# Patient Record
Sex: Female | Born: 1955 | Race: White | Hispanic: No | Marital: Married | Smoking: Former smoker
Health system: Southern US, Community
[De-identification: ages and names within clinical notes are randomized; demographics above are authoritative.]

## PROBLEM LIST (undated history)

## (undated) DIAGNOSIS — C801 Malignant (primary) neoplasm, unspecified: Secondary | ICD-10-CM

## (undated) DIAGNOSIS — N189 Chronic kidney disease, unspecified: Secondary | ICD-10-CM

## (undated) DIAGNOSIS — Z9109 Other allergy status, other than to drugs and biological substances: Secondary | ICD-10-CM

## (undated) DIAGNOSIS — I10 Essential (primary) hypertension: Secondary | ICD-10-CM

## (undated) DIAGNOSIS — E7800 Pure hypercholesterolemia, unspecified: Secondary | ICD-10-CM

## (undated) DIAGNOSIS — M1990 Unspecified osteoarthritis, unspecified site: Secondary | ICD-10-CM

## (undated) DIAGNOSIS — F902 Attention-deficit hyperactivity disorder, combined type: Secondary | ICD-10-CM

## (undated) DIAGNOSIS — F419 Anxiety disorder, unspecified: Secondary | ICD-10-CM

## (undated) DIAGNOSIS — K219 Gastro-esophageal reflux disease without esophagitis: Secondary | ICD-10-CM

## (undated) HISTORY — DX: Gastro-esophageal reflux disease without esophagitis: K21.9

## (undated) HISTORY — PX: COLONOSCOPY: SHX174

## (undated) HISTORY — DX: Chronic kidney disease, unspecified: N18.9

## (undated) HISTORY — PX: RENAL BIOPSY, OPEN: SUR143

## (undated) HISTORY — DX: Pure hypercholesterolemia, unspecified: E78.00

## (undated) HISTORY — DX: Essential (primary) hypertension: I10

## (undated) HISTORY — PX: POLYPECTOMY: SHX149

## (undated) HISTORY — PX: BREAST BIOPSY: SHX20

## (undated) HISTORY — DX: Anxiety disorder, unspecified: F41.9

## (undated) HISTORY — DX: Unspecified osteoarthritis, unspecified site: M19.90

## (undated) HISTORY — DX: Attention-deficit hyperactivity disorder, combined type: F90.2

## (undated) HISTORY — DX: Other allergy status, other than to drugs and biological substances: Z91.09

## (undated) HISTORY — DX: Malignant (primary) neoplasm, unspecified: C80.1

---

## 2000-12-09 ENCOUNTER — Other Ambulatory Visit: Admission: RE | Admit: 2000-12-09 | Discharge: 2000-12-09 | Payer: Self-pay | Admitting: Obstetrics and Gynecology

## 2002-11-06 ENCOUNTER — Other Ambulatory Visit: Admission: RE | Admit: 2002-11-06 | Discharge: 2002-11-06 | Payer: Self-pay | Admitting: Obstetrics and Gynecology

## 2004-12-01 ENCOUNTER — Other Ambulatory Visit: Admission: RE | Admit: 2004-12-01 | Discharge: 2004-12-01 | Payer: Self-pay | Admitting: Obstetrics and Gynecology

## 2005-12-03 ENCOUNTER — Other Ambulatory Visit: Admission: RE | Admit: 2005-12-03 | Discharge: 2005-12-03 | Payer: Self-pay | Admitting: Obstetrics and Gynecology

## 2008-11-12 ENCOUNTER — Ambulatory Visit: Payer: Self-pay | Admitting: Internal Medicine

## 2008-11-13 ENCOUNTER — Telehealth: Payer: Self-pay | Admitting: Internal Medicine

## 2008-11-14 ENCOUNTER — Encounter: Payer: Self-pay | Admitting: Internal Medicine

## 2008-11-28 ENCOUNTER — Ambulatory Visit: Payer: Self-pay | Admitting: Internal Medicine

## 2008-11-28 ENCOUNTER — Encounter: Payer: Self-pay | Admitting: Internal Medicine

## 2008-11-29 ENCOUNTER — Encounter: Payer: Self-pay | Admitting: Internal Medicine

## 2010-05-20 NOTE — Miscellaneous (Signed)
Summary: Previsit  Clinical Lists Changes  Medications: Added new medication of MOVIPREP 100 GM  SOLR (PEG-KCL-NACL-NASULF-NA ASC-C) As directed - Signed Rx of MOVIPREP 100 GM  SOLR (PEG-KCL-NACL-NASULF-NA ASC-C) As directed;  #1 x 0;  Signed;  Entered by: Clide Cliff RN;  Authorized by: Hilarie Fredrickson MD;  Method used: [facility] [phone]*, [address] [place], [place]  [address], Ph: [phone] or [phone], Fax: [phone] Observations: Added new observation of NKA: T (11/12/2008 8:32)    Prescriptions: MOVIPREP 100 GM  SOLR (PEG-KCL-NACL-NASULF-NA ASC-C) As directed  #1 x 0   Entered by:   Clide Cliff RN   Authorized by:   Hilarie Fredrickson MD   Signed by:   Clide Cliff RN on 11/12/2008   Method used:   [facility]ronically to        [facility] [phone]* (retail)       [address]       [place], [place]  [address]       Ph: [phone] or [phone]       Fax: [phone]   RxID:   [id number]

## 2010-05-20 NOTE — Medication Information (Signed)
Summary: [facility]REP [facility]  [facility]P [facility]   Imported By: [address] 11/15/2008 09:15:28  _____________________________________________________________________  External Attachment:    Type:   Image     Comment:   External Document

## 2010-05-20 NOTE — Letter (Signed)
Summary: Patient Notice- Polyp Results  [facility]  [address] [place], [place] [address]   Phone: [phone]  Fax: [phone]        November 29, 2008 MRN: [id number]    Anaria Krizan [address] [place], [place]  [address]    Dear Ms. Bartolomei,  I am pleased to inform you that the colon polyp(s) removed during your recent colonoscopy was (were) found to be benign (no cancer detected) upon pathologic examination.  I recommend you have a repeat colonoscopy examination in 3 years to look for recurrent polyps, as having colon polyps increases your risk for having recurrent polyps or even colon cancer in the future.  Should you develop new or worsening symptoms of abdominal pain, bowel habit changes or bleeding from the rectum or bowels, please schedule an evaluation with either your primary care physician or with me.  Additional information/recommendations:  __ No further action with gastroenterology is needed at this time. Please      follow-up with your primary care physician for your other healthcare      needs.    Please call us if you are having persistent problems or have questions about your condition that have not been fully answered at this time.  Sincerely,  Hilarie Fredrickson MD  This letter has been electronically signed by your physician.

## 2010-05-20 NOTE — Progress Notes (Signed)
Summary: Prep   Phone Note Call from Patient Call back at Home Phone [phone]   Caller: Patient Call For: Dr Marina Goodell Reason for Call: Talk to Nurse Details for Reason: Prep Pre-Auth Summary of Call: Pt stated her insurance will not pay for her prep; Stated her pharmacy called, she just wanted to make sure we got approval. Initial call taken by: Dwan Bolt,  November 13, 2008 9:36 AM  Follow-up for Phone Call        called [employer] Attn:  Florestine Avers Prep  they gave it an override and it was approved.  [facility] and informed [facility] of this.  Attempted to call patient no answer.   Follow-up by: [place] Cage NCMA,  November 14, 2008 8:14 AM

## 2011-04-21 DIAGNOSIS — C801 Malignant (primary) neoplasm, unspecified: Secondary | ICD-10-CM

## 2011-04-21 HISTORY — DX: Malignant (primary) neoplasm, unspecified: C80.1

## 2011-09-21 ENCOUNTER — Encounter: Payer: Self-pay | Admitting: Internal Medicine

## 2011-11-18 ENCOUNTER — Ambulatory Visit (AMBULATORY_SURGERY_CENTER): Payer: BC Managed Care – PPO

## 2011-11-18 ENCOUNTER — Encounter: Payer: Self-pay | Admitting: Internal Medicine

## 2011-11-18 VITALS — Ht 63.0 in | Wt 160.0 lb

## 2011-11-18 DIAGNOSIS — Z860100 Personal history of colon polyps, unspecified: Secondary | ICD-10-CM

## 2011-11-18 MED ORDER — MOVIPREP 100 G PO SOLR: 1.0000 | Freq: Once | ORAL | Status: DC

## 2011-12-02 ENCOUNTER — Encounter: Payer: Self-pay | Admitting: Internal Medicine

## 2011-12-02 ENCOUNTER — Ambulatory Visit (AMBULATORY_SURGERY_CENTER): Payer: BC Managed Care – PPO | Admitting: Internal Medicine

## 2011-12-02 VITALS — BP 125/85 | HR 55 | Temp 97.3°F | Resp 20 | Ht 63.0 in | Wt 160.0 lb

## 2011-12-02 DIAGNOSIS — Z86010 Personal history of colonic polyps: Secondary | ICD-10-CM

## 2011-12-02 DIAGNOSIS — Z1211 Encounter for screening for malignant neoplasm of colon: Secondary | ICD-10-CM

## 2011-12-02 DIAGNOSIS — D126 Benign neoplasm of colon, unspecified: Secondary | ICD-10-CM

## 2011-12-02 MED ORDER — SODIUM CHLORIDE 0.9 % IV SOLN: 500.0000 mL | INTRAVENOUS | Status: DC

## 2011-12-02 NOTE — Progress Notes (Signed)
Patient did not have preoperative order for IV antibiotic SSI prophylaxis. (G8918)  Patient did not experience any of the following events: a burn prior to discharge; a fall within the facility; wrong site/side/patient/procedure/implant event; or a hospital transfer or hospital admission upon discharge from the facility. (G8907)  

## 2011-12-02 NOTE — Op Note (Signed)
[facility] [address] [place], [place]  [address]  COLONOSCOPY PROCEDURE REPORT  PATIENT:  Jessica Galvan, Jessica Galvan  MR#:  [id number] BIRTHDATE:  06-07-1955, 56 yrs. old  GENDER:  female ENDOSCOPIST:  Wilhemina Bonito. Eda Keys, MD REF. BY:  Surveillance Program Recall, PROCEDURE DATE:  12/02/2011 PROCEDURE:  Colonoscopy with snare polypectomy x 1 ASA CLASS:  Class II INDICATIONS:  history of pre-cancerous (adenomatous) colon polyps, surveillance and high-risk screening ; index 8-10 w/ multiple (3) and large (12mm) adenomas MEDICATIONS:   MAC sedation, administered by CRNA, propofol (Diprivan) 270 mg IV  DESCRIPTION OF PROCEDURE:   After the risks benefits and alternatives of the procedure were thoroughly explained, informed consent was obtained.  Digital rectal exam was performed and revealed no abnormalities.   The LB [id number] endoscope was introduced through the anus and advanced to the cecum, which was identified by both the appendix and ileocecal valve, without limitations.  The quality of the prep was excellent, using MoviPrep.  The instrument was then slowly withdrawn as the colon was fully examined. <<PROCEDUREIMAGES>>  FINDINGS:  A diminutive polyp was found in the ascending colon and snared without cautery. No meaningful tissue retrieved for submission.  Mild diverticulosis was found in the sigmoid colon. Otherwise normal colonoscopy without other polyps, masses, vascular ectasias, or inflammatory changes.   Retroflexed views in the rectum revealed no abnormalities.    The time to cecum = 2:39 minutes. The scope was then withdrawn in 11:57  minutes from the cecum and the procedure completed.  COMPLICATIONS:  None  ENDOSCOPIC IMPRESSION: 1) Diminutive polyp in the ascending colon - removed 2) Mild diverticulosis in the sigmoid colon 3) Otherwise normal colonoscopy  RECOMMENDATIONS: 1) Follow up colonoscopy in 5 years  ______________________________ Wilhemina Bonito.  Eda Keys, MD  CC:  Elias Else, MD;   The Patient  n. eSIGNED:   Wilhemina Bonito. Eda Keys at 12/02/2011 09:15 AM  Sherrie Mustache, [phone]

## 2011-12-02 NOTE — Patient Instructions (Signed)
YOU HAD AN ENDOSCOPIC PROCEDURE TODAY AT THE [facility]: Refer to the procedure report that was given to you for any specific questions about what was found during the examination.  If the procedure report does not answer your questions, please call your gastroenterologist to clarify.  If you requested that your care partner not be given the details of your procedure findings, then the procedure report has been included in a sealed envelope for you to review at your convenience later.  YOU SHOULD EXPECT: Some feelings of bloating in the abdomen. Passage of more gas than usual.  Walking can help get rid of the air that was put into your GI tract during the procedure and reduce the bloating. If you had a lower endoscopy (such as a colonoscopy or flexible sigmoidoscopy) you may notice spotting of blood in your stool or on the toilet paper. If you underwent a bowel prep for your procedure, then you may not have a normal bowel movement for a few days.  DIET: Your first meal following the procedure should be a light meal and then it is ok to progress to your normal diet.  A half-sandwich or bowl of soup is an example of a good first meal.  Heavy or fried foods are harder to digest and may make you feel nauseous or bloated.  Likewise meals heavy in dairy and vegetables can cause extra gas to form and this can also increase the bloating.  Drink plenty of fluids but you should avoid alcoholic beverages for 24 hours.  ACTIVITY: Your care partner should take you home directly after the procedure.  You should plan to take it easy, moving slowly for the rest of the day.  You can resume normal activity the day after the procedure however you should NOT DRIVE or use heavy machinery for 24 hours (because of the sedation medicines used during the test).    SYMPTOMS TO REPORT IMMEDIATELY: A gastroenterologist can be reached at any hour.  During normal business hours, 8:30 AM to 5:00 PM Monday through Friday,  call [phone].  After hours and on weekends, please call the GI answering service at [phone] who will take a message and have the physician on call contact you.   Following lower endoscopy (colonoscopy or flexible sigmoidoscopy):  Excessive amounts of blood in the stool  Significant tenderness or worsening of abdominal pains  Swelling of the abdomen that is new, acute  Fever of 100F or higher    FOLLOW UP: If any biopsies were taken you will be contacted by phone or by letter within the next 1-3 weeks.  Call your gastroenterologist if you have not heard about the biopsies in 3 weeks.  Our staff will call the home number listed on your records the next business day following your procedure to check on you and address any questions or concerns that you may have at that time regarding the information given to you following your procedure. This is a courtesy call and so if there is no answer at the home number and we have not heard from you through the emergency physician on call, we will assume that you have returned to your regular daily activities without incident.  SIGNATURES/CONFIDENTIALITY: You and/or your care partner have signed paperwork which will be entered into your electronic medical record.  These signatures attest to the fact that that the information above on your After Visit Summary has been reviewed and is understood.  Full responsibility of the confidentiality   of this discharge information lies with you and/or your care-partner.    INFORMATION ON DIVERTICULOSIS ,HIGH FIBER DIET & POLYPS GIVEN TO YOU TODAY

## 2011-12-02 NOTE — Progress Notes (Signed)
PT STATES SHE HAS A VERY SORE PAINFUL RECTUM. EWM

## 2011-12-03 ENCOUNTER — Telehealth: Payer: Self-pay

## 2011-12-03 NOTE — Telephone Encounter (Signed)
  Follow up Call-  Call back number 12/02/2011  Post procedure Call Back phone  # [phone]  Permission to leave phone message Yes     Patient questions:  Do you have a fever, pain , or abdominal swelling? no Pain Score  0 *  Have you tolerated food without any problems? yes  Have you been able to return to your normal activities? yes  Do you have any questions about your discharge instructions: Diet   no Medications  no Follow up visit  no  Do you have questions or concerns about your Care? no  Actions: * If pain score is 4 or above: No action needed, pain <4.  I spoke with the pt's husband he said the pt was in the shower.  He then said his wife was asking him to ask [place] why there was a red area on her rt hip about the size of quarter or half dollar.  I said I did not know why that would be there.  That the pt was positioned in the procedure room on her left side and there should not be a electro lead on the hip area.  I advised them to call back if it continues and if any discomfort.  He relayed the message to his wife.  Maw

## 2013-07-03 ENCOUNTER — Encounter: Payer: Self-pay | Admitting: Cardiology

## 2013-07-03 DIAGNOSIS — E785 Hyperlipidemia, unspecified: Secondary | ICD-10-CM

## 2013-10-21 ENCOUNTER — Encounter (HOSPITAL_COMMUNITY): Payer: Self-pay | Admitting: Emergency Medicine

## 2013-10-21 ENCOUNTER — Emergency Department (HOSPITAL_COMMUNITY)
Admission: EM | Admit: 2013-10-21 | Discharge: 2013-10-22 | Disposition: A | Discharge status: Home / Self Care | Payer: BC Managed Care – PPO | Attending: Emergency Medicine | Admitting: Emergency Medicine

## 2013-10-21 ENCOUNTER — Emergency Department (HOSPITAL_COMMUNITY): Payer: BC Managed Care – PPO

## 2013-10-21 DIAGNOSIS — M129 Arthropathy, unspecified: Secondary | ICD-10-CM | POA: Insufficient documentation

## 2013-10-21 DIAGNOSIS — Z85828 Personal history of other malignant neoplasm of skin: Secondary | ICD-10-CM | POA: Insufficient documentation

## 2013-10-21 DIAGNOSIS — F909 Attention-deficit hyperactivity disorder, unspecified type: Secondary | ICD-10-CM | POA: Insufficient documentation

## 2013-10-21 DIAGNOSIS — K219 Gastro-esophageal reflux disease without esophagitis: Secondary | ICD-10-CM | POA: Insufficient documentation

## 2013-10-21 DIAGNOSIS — E7800 Pure hypercholesterolemia, unspecified: Secondary | ICD-10-CM | POA: Insufficient documentation

## 2013-10-21 DIAGNOSIS — Z791 Long term (current) use of non-steroidal anti-inflammatories (NSAID): Secondary | ICD-10-CM | POA: Insufficient documentation

## 2013-10-21 DIAGNOSIS — N200 Calculus of kidney: Secondary | ICD-10-CM | POA: Insufficient documentation

## 2013-10-21 DIAGNOSIS — Z79899 Other long term (current) drug therapy: Secondary | ICD-10-CM | POA: Insufficient documentation

## 2013-10-21 DIAGNOSIS — Z87891 Personal history of nicotine dependence: Secondary | ICD-10-CM | POA: Insufficient documentation

## 2013-10-21 LAB — URINALYSIS, ROUTINE W REFLEX MICROSCOPIC
Bilirubin Urine: NEGATIVE
Glucose, UA: NEGATIVE mg/dL
Ketones, ur: 15 mg/dL — AB
Nitrite: NEGATIVE
Protein, ur: NEGATIVE mg/dL
Specific Gravity, Urine: 1.021 (ref 1.005–1.030)
Urobilinogen, UA: 0.2 mg/dL (ref 0.0–1.0)
pH: 8 (ref 5.0–8.0)

## 2013-10-21 LAB — URINE MICROSCOPIC-ADD ON

## 2013-10-21 MED ORDER — ONDANSETRON 8 MG PO TBDP
8.0000 mg | ORAL_TABLET | Freq: Once | ORAL | Status: AC
  Administered 2013-10-21: 8 mg via ORAL
  Filled 2013-10-21: qty 1

## 2013-10-21 MED ORDER — SODIUM CHLORIDE 0.9 % IV BOLUS (SEPSIS)
1000.0000 mL | Freq: Once | INTRAVENOUS | Status: AC
  Administered 2013-10-21: 1000 mL via INTRAVENOUS

## 2013-10-21 MED ORDER — HYDROMORPHONE HCL PF 1 MG/ML IJ SOLN
1.0000 mg | Freq: Once | INTRAMUSCULAR | Status: AC
  Administered 2013-10-21: 1 mg via INTRAVENOUS
  Filled 2013-10-21: qty 1

## 2013-10-21 MED ORDER — FENTANYL CITRATE 0.05 MG/ML IJ SOLN
50.0000 ug | Freq: Once | INTRAMUSCULAR | Status: AC
  Administered 2013-10-21: 50 ug via INTRAVENOUS
  Filled 2013-10-21: qty 2

## 2013-10-21 NOTE — ED Provider Notes (Signed)
CSN: [id number]     Arrival date & time 10/21/13  2221 History   None    Chief Complaint  Patient presents with  . Flank Pain   HPI  Jessica Galvan is a 58 y.o. female with a PMH of arthritis, skin cancer, ADHD, hypercholesteremia, and GERD who presents to the ED for evaluation of flank pain. History was provided by the patient. Patient states that about an hour PTA she developed sudden onset right back pain which has moved to her right flank and radiates to her groin. Her pain is a constant sharp stabbing pain. Nothing makes her pain better/worse. She did not take anything for pain PTA. No hx of similar pain in the past. Patient denies any hx of kidney stones in the past. Patient has had nausea with one episode of emesis in the ED. No dysuria, hematuria, difficulty with urination, diarrhea, constipation, chest pain, SOB, dizziness, lightheadedness or other concerns.    Past Medical History  Diagnosis Date  . Environmental allergies   . Arthritis   . GERD (gastroesophageal reflux disease)   . Hypercholesterolemia   . Cancer 2013    LEFT LEG  . ADHD (attention deficit hyperactivity disorder), combined type    Past Surgical History  Procedure Laterality Date  . Cesarean section      2  . Colonoscopy    . Polypectomy     Family History  Problem Relation Age of Onset  . Diabetes Mother   . Heart disease Mother   . Diabetes Father   . Heart disease Father   . Colon cancer Neg Hx   . Rectal cancer Neg Hx   . Stomach cancer Neg Hx   . Esophageal cancer Neg Hx    History  Substance Use Topics  . Smoking status: Former Smoker    Quit date: 11/18/2006  . Smokeless tobacco: Never Used  . Alcohol Use: 0.6 oz/week    1 Glasses of wine per week   OB History   Grav Para Term Preterm Abortions TAB SAB Ect Mult Living                 Review of Systems  Constitutional: Negative for fever, chills, diaphoresis, activity change, appetite change and fatigue.  Respiratory: Negative for  cough and shortness of breath.   Cardiovascular: Negative for chest pain and leg swelling.  Gastrointestinal: Positive for nausea, vomiting and abdominal pain. Negative for diarrhea and constipation.  Genitourinary: Positive for flank pain. Negative for dysuria, hematuria and difficulty urinating.  Musculoskeletal: Positive for back pain. Negative for myalgias.  Neurological: Negative for dizziness, weakness, light-headedness and headaches.   Allergies  Lipitor  Home Medications   Prior to Admission medications   Medication Sig Start Date End Date Taking? Authorizing Provider  amphetamine-dextroamphetamine (ADDERALL XR) 30 MG 24 hr capsule Take 30 mg by mouth every morning.   Yes Historical Provider, MD  meloxicam (MOBIC) 15 MG tablet Take 15 mg by mouth daily.  10/31/11  Yes Historical Provider, MD  omeprazole (PRILOSEC OTC) 20 MG tablet Take 20 mg by mouth daily.   Yes Historical Provider, MD  simvastatin (ZOCOR) 40 MG tablet Take 40 mg by mouth at bedtime.  11/09/11  Yes Historical Provider, MD   BP 166/86  Pulse 84  Temp(Src) 97.7 F (36.5 C) (Oral)  Resp 18  SpO2 100%  Filed Vitals:   10/21/13 2226 10/21/13 2242 10/22/13 0119  BP: 157/94 166/86 119/71  Pulse: 84  71  Temp:  97.7 F (36.5 C)    TempSrc: Oral    Resp: 18  16  SpO2: 100%  96%    Physical Exam  Nursing note and vitals reviewed. Constitutional: She is oriented to person, place, and time. She appears well-developed and well-nourished. No distress.  Patient appears to be in pain  HENT:  Head: Normocephalic and atraumatic.  Right Ear: External ear normal.  Left Ear: External ear normal.  Mouth/Throat: Oropharynx is clear and moist.  Eyes: Conjunctivae are normal. Right eye exhibits no discharge. Left eye exhibits no discharge.  Neck: Normal range of motion. Neck supple.  Cardiovascular: Normal rate, regular rhythm, normal heart sounds and intact distal pulses.  Exam reveals no gallop and no friction rub.    No murmur heard. Pulmonary/Chest: Effort normal and breath sounds normal. No respiratory distress. She has no wheezes. She has no rales. She exhibits no tenderness.  Abdominal: Soft. She exhibits no distension and no mass. There is tenderness. There is no rebound and no guarding.  Tenderness to palpation to the right flank   Musculoskeletal: Normal range of motion. She exhibits no edema and no tenderness.  Neurological: She is alert and oriented to person, place, and time.  Skin: Skin is warm and dry. She is not diaphoretic.     ED Course  Procedures (including critical care time) Labs Review Labs Reviewed - No data to display  Imaging Review Ct Abdomen Pelvis Wo Contrast  10/21/2013   CLINICAL DATA:  Right flank pain.  EXAM: CT ABDOMEN AND PELVIS WITHOUT CONTRAST  TECHNIQUE: Multidetector CT imaging of the abdomen and pelvis was performed following the standard protocol without IV contrast.  COMPARISON:  None.  FINDINGS: Lung bases are clear.  No effusions.  Heart is normal size.  Liver, gallbladder, spleen, pancreas, adrenals and left kidney have an unremarkable unenhanced appearance.  Punctate 2 mm stone in the proximal right ureter. Mild right hydronephrosis. No additional ureteral stones. Urinary bladder is unremarkable.  Uterus and adnexa unremarkable. Appendix is visualized and is normal. Stomach, large and small bowel grossly unremarkable. No free fluid, free air or adenopathy. No acute bony abnormality.  IMPRESSION: 2 mm proximal right ureteral stone with mild right hydronephrosis.   Electronically Signed   By: Rolm Baptise M.D.   On: 10/21/2013 23:53     EKG Interpretation None      Results for orders placed during the hospital encounter of 10/21/13  CBC WITH DIFFERENTIAL      Result Value Ref Range   WBC 7.6  4.0 - 10.5 K/uL   RBC 4.66  3.87 - 5.11 MIL/uL   Hemoglobin 14.4  12.0 - 15.0 g/dL   HCT 42.3  36.0 - 46.0 %   MCV 90.8  78.0 - 100.0 fL   MCH 30.9  26.0 - 34.0 pg    MCHC 34.0  30.0 - 36.0 g/dL   RDW 13.0  11.5 - 15.5 %   Platelets 238  150 - 400 K/uL   Neutrophils Relative % 51  43 - 77 %   Neutro Abs 3.9  1.7 - 7.7 K/uL   Lymphocytes Relative 39  12 - 46 %   Lymphs Abs 3.0  0.7 - 4.0 K/uL   Monocytes Relative 7  3 - 12 %   Monocytes Absolute 0.5  0.1 - 1.0 K/uL   Eosinophils Relative 2  0 - 5 %   Eosinophils Absolute 0.2  0.0 - 0.7 K/uL   Basophils Relative 1  0 -  1 %   Basophils Absolute 0.0  0.0 - 0.1 K/uL  COMPREHENSIVE METABOLIC PANEL      Result Value Ref Range   Sodium 144  137 - 147 mEq/L   Potassium 4.3  3.7 - 5.3 mEq/L   Chloride 103  96 - 112 mEq/L   CO2 22  19 - 32 mEq/L   Glucose, Bld 169 (*) 70 - 99 mg/dL   BUN 28 (*) 6 - 23 mg/dL   Creatinine, Ser 0.89  0.50 - 1.10 mg/dL   Calcium 10.0  8.4 - 10.5 mg/dL   Total Protein 7.6  6.0 - 8.3 g/dL   Albumin 4.5  3.5 - 5.2 g/dL   AST 24  0 - 37 U/L   ALT 22  0 - 35 U/L   Alkaline Phosphatase 72  39 - 117 U/L   Total Bilirubin <0.2 (*) 0.3 - 1.2 mg/dL   GFR calc non Af Amer 71 (*) >90 mL/min   GFR calc Af Amer 82 (*) >90 mL/min   Anion gap 19 (*) 5 - 15  URINALYSIS, ROUTINE W REFLEX MICROSCOPIC      Result Value Ref Range   Color, Urine YELLOW  YELLOW   APPearance CLOUDY (*) CLEAR   Specific Gravity, Urine 1.021  1.005 - 1.030   pH 8.0  5.0 - 8.0   Glucose, UA NEGATIVE  NEGATIVE mg/dL   Hgb urine dipstick LARGE (*) NEGATIVE   Bilirubin Urine NEGATIVE  NEGATIVE   Ketones, ur 15 (*) NEGATIVE mg/dL   Protein, ur NEGATIVE  NEGATIVE mg/dL   Urobilinogen, UA 0.2  0.0 - 1.0 mg/dL   Nitrite NEGATIVE  NEGATIVE   Leukocytes, UA TRACE (*) NEGATIVE  URINE MICROSCOPIC-ADD ON      Result Value Ref Range   Squamous Epithelial / LPF RARE  RARE   WBC, UA 0-2  <3 WBC/hpf   RBC / HPF 21-50  <3 RBC/hpf  BASIC METABOLIC PANEL      Result Value Ref Range   Sodium 144  137 - 147 mEq/L   Potassium 4.0  3.7 - 5.3 mEq/L   Chloride 110  96 - 112 mEq/L   CO2 23  19 - 32 mEq/L   Glucose, Bld  143 (*) 70 - 99 mg/dL   BUN 24 (*) 6 - 23 mg/dL   Creatinine, Ser 0.71  0.50 - 1.10 mg/dL   Calcium 8.4  8.4 - 10.5 mg/dL   GFR calc non Af Amer >90  >90 mL/min   GFR calc Af Amer >90  >90 mL/min   Anion gap 11  5 - 15     MDM   Jessica Galvan is a 58 y.o. female with a PMH of arthritis, ADHD, hypercholesteremia, and GERD who presents to the ED for evaluation of flank pain. Etiology of flank pain likely due to a 2 mm right ureteral stone with mild hydronephrosis. No evidence of a UTI. No leukocytosis. Abdominal exam benign. Patient had improvements in her pain throughout her ED visit and pain was controlled upon discharge. Patient had elevated AG (19) which reduced to 11 with IV fluids. Also had elevated BUN (28) which reduced to 24 with IV fluids. Patient given urine strainer and urology follow-up. Return precautions, discharge instructions, and follow-up was discussed with the patient before discharge.     New Prescriptions   ONDANSETRON (ZOFRAN ODT) 4 MG DISINTEGRATING TABLET    Take 1 tablet (4 mg total) by mouth every 8 (eight)  hours as needed for nausea or vomiting.   OXYCODONE-ACETAMINOPHEN (PERCOCET/ROXICET) 5-325 MG PER TABLET    Take 2 tablets by mouth every 4 (four) hours as needed for severe pain.   TAMSULOSIN (FLOMAX) 0.4 MG CAPS CAPSULE    Take 1 capsule (0.4 mg total) by mouth daily.     Final impressions: 1. Right nephrolithiasis       Harold Hedge Ryu Cerreta PA-C       Lucila Maine, PA-C 10/22/13 1119

## 2013-10-21 NOTE — ED Notes (Signed)
Pt arrived to the ED with right sided flank pain.  Pt states pain radiates to the right lower front of her abdomen.  Pt states she is not having difficulty urinating.  Pt feels nausea but had not had any episodes of emesis

## 2013-10-22 LAB — COMPREHENSIVE METABOLIC PANEL
ALT: 22 U/L (ref 0–35)
AST: 24 U/L (ref 0–37)
Albumin: 4.5 g/dL (ref 3.5–5.2)
Alkaline Phosphatase: 72 U/L (ref 39–117)
Anion gap: 19 — ABNORMAL HIGH (ref 5–15)
BUN: 28 mg/dL — ABNORMAL HIGH (ref 6–23)
CO2: 22 mEq/L (ref 19–32)
Calcium: 10 mg/dL (ref 8.4–10.5)
Chloride: 103 mEq/L (ref 96–112)
Creatinine, Ser: 0.89 mg/dL (ref 0.50–1.10)
GFR calc Af Amer: 82 mL/min — ABNORMAL LOW (ref >90)
GFR calc non Af Amer: 71 mL/min — ABNORMAL LOW (ref >90)
Glucose, Bld: 169 mg/dL — ABNORMAL HIGH (ref 70–99)
Potassium: 4.3 mEq/L (ref 3.7–5.3)
Sodium: 144 mEq/L (ref 137–147)
Total Bilirubin: <0.2 mg/dL — ABNORMAL LOW (ref 0.3–1.2)
Total Protein: 7.6 g/dL (ref 6.0–8.3)

## 2013-10-22 LAB — BASIC METABOLIC PANEL
Anion gap: 11 (ref 5–15)
BUN: 24 mg/dL — ABNORMAL HIGH (ref 6–23)
CO2: 23 mEq/L (ref 19–32)
Calcium: 8.4 mg/dL (ref 8.4–10.5)
Chloride: 110 mEq/L (ref 96–112)
Creatinine, Ser: 0.71 mg/dL (ref 0.50–1.10)
GFR calc Af Amer: >90 mL/min (ref >90)
GFR calc non Af Amer: >90 mL/min (ref >90)
Glucose, Bld: 143 mg/dL — ABNORMAL HIGH (ref 70–99)
Potassium: 4 mEq/L (ref 3.7–5.3)
Sodium: 144 mEq/L (ref 137–147)

## 2013-10-22 LAB — CBC WITH DIFFERENTIAL/PLATELET
Basophils Absolute: 0 10*3/uL (ref 0.0–0.1)
Basophils Relative: 1 % (ref 0–1)
Eosinophils Absolute: 0.2 10*3/uL (ref 0.0–0.7)
Eosinophils Relative: 2 % (ref 0–5)
HCT: 42.3 % (ref 36.0–46.0)
Hemoglobin: 14.4 g/dL (ref 12.0–15.0)
Lymphocytes Relative: 39 % (ref 12–46)
Lymphs Abs: 3 10*3/uL (ref 0.7–4.0)
MCH: 30.9 pg (ref 26.0–34.0)
MCHC: 34 g/dL (ref 30.0–36.0)
MCV: 90.8 fL (ref 78.0–100.0)
Monocytes Absolute: 0.5 10*3/uL (ref 0.1–1.0)
Monocytes Relative: 7 % (ref 3–12)
Neutro Abs: 3.9 10*3/uL (ref 1.7–7.7)
Neutrophils Relative %: 51 % (ref 43–77)
Platelets: 238 10*3/uL (ref 150–400)
RBC: 4.66 MIL/uL (ref 3.87–5.11)
RDW: 13 % (ref 11.5–15.5)
WBC: 7.6 10*3/uL (ref 4.0–10.5)

## 2013-10-22 MED ORDER — OXYCODONE-ACETAMINOPHEN 5-325 MG PO TABS: 2.0000 | ORAL_TABLET | ORAL | Status: DC | PRN

## 2013-10-22 MED ORDER — KETOROLAC TROMETHAMINE 30 MG/ML IJ SOLN
30.0000 mg | Freq: Once | INTRAMUSCULAR | Status: AC
  Administered 2013-10-22: 30 mg via INTRAVENOUS
  Filled 2013-10-22: qty 1

## 2013-10-22 MED ORDER — SODIUM CHLORIDE 0.9 % IV BOLUS (SEPSIS)
1000.0000 mL | Freq: Once | INTRAVENOUS | Status: AC
  Administered 2013-10-22: 1000 mL via INTRAVENOUS

## 2013-10-22 MED ORDER — ONDANSETRON 4 MG PO TBDP: 4.0000 mg | ORAL_TABLET | Freq: Three times a day (TID) | ORAL | Status: DC | PRN

## 2013-10-22 MED ORDER — OXYCODONE-ACETAMINOPHEN 5-325 MG PO TABS
2.0000 | ORAL_TABLET | Freq: Once | ORAL | Status: AC
  Administered 2013-10-22: 2 via ORAL
  Filled 2013-10-22: qty 2

## 2013-10-22 MED ORDER — TAMSULOSIN HCL 0.4 MG PO CAPS: 0.4000 mg | ORAL_CAPSULE | Freq: Every day | ORAL | Status: DC

## 2013-10-22 NOTE — Discharge Instructions (Signed)
Take percocet for severe pain - Please be careful with this medication.  It can cause drowsiness.  Use caution while driving, operating machinery, drinking alcohol, or any other activities that may impair your physical or mental abilities.   Take zofran as needed for nausea  Take flomax once daily until you pass your stone - this will help you pass your stone  Return to the emergency department if you develop any changing/worsening condition, uncontrolled pain, fever, repeated vomiting, or any other concerns (please read additional information regarding your condition below)   Kidney Stones Kidney stones (urolithiasis) are deposits that form inside your kidneys. The intense pain is caused by the stone moving through the urinary tract. When the stone moves, the ureter goes into spasm around the stone. The stone is usually passed in the urine.  CAUSES   A disorder that makes certain neck glands produce too much parathyroid hormone (primary hyperparathyroidism).  A buildup of uric acid crystals, similar to gout in your joints.  Narrowing (stricture) of the ureter.  A kidney obstruction present at birth (congenital obstruction).  Previous surgery on the kidney or ureters.  Numerous kidney infections. SYMPTOMS   Feeling sick to your stomach (nauseous).  Throwing up (vomiting).  Blood in the urine (hematuria).  Pain that usually spreads (radiates) to the groin.  Frequency or urgency of urination. DIAGNOSIS   Taking a history and physical exam.  Blood or urine tests.  CT scan.  Occasionally, an examination of the inside of the urinary bladder (cystoscopy) is performed. TREATMENT   Observation.  Increasing your fluid intake.  Extracorporeal shock wave lithotripsy--This is a noninvasive procedure that uses shock waves to break up kidney stones.  Surgery may be needed if you have severe pain or persistent obstruction. There are various surgical procedures. Most of the  procedures are performed with the use of small instruments. Only small incisions are needed to accommodate these instruments, so recovery time is minimized. The size, location, and chemical composition are all important variables that will determine the proper choice of action for you. Talk to your health care provider to better understand your situation so that you will minimize the risk of injury to yourself and your kidney.  HOME CARE INSTRUCTIONS   Drink enough water and fluids to keep your urine clear or pale yellow. This will help you to pass the stone or stone fragments.  Strain all urine through the provided strainer. Keep all particulate matter and stones for your health care provider to see. The stone causing the pain may be as small as a grain of salt. It is very important to use the strainer each and every time you pass your urine. The collection of your stone will allow your health care provider to analyze it and verify that a stone has actually passed. The stone analysis will often identify what you can do to reduce the incidence of recurrences.  Only take over-the-counter or prescription medicines for pain, discomfort, or fever as directed by your health care provider.  Make a follow-up appointment with your health care provider as directed.  Get follow-up X-rays if required. The absence of pain does not always mean that the stone has passed. It may have only stopped moving. If the urine remains completely obstructed, it can cause loss of kidney function or even complete destruction of the kidney. It is your responsibility to make sure X-rays and follow-ups are completed. Ultrasounds of the kidney can show blockages and the status of the kidney.  Ultrasounds are not associated with any radiation and can be performed easily in a matter of minutes. SEEK MEDICAL CARE IF:  You experience pain that is progressive and unresponsive to any pain medicine you have been prescribed. SEEK IMMEDIATE  MEDICAL CARE IF:   Pain cannot be controlled with the prescribed medicine.  You have a fever or shaking chills.  The severity or intensity of pain increases over 18 hours and is not relieved by pain medicine.  You develop a new onset of abdominal pain.  You feel faint or pass out.  You are unable to urinate. MAKE SURE YOU:   Understand these instructions.  Will watch your condition.  Will get help right away if you are not doing well or get worse. Document Released: 04/06/2005 Document Revised: 12/07/2012 Document Reviewed: 09/07/2012 [facility] Patient Information 2015 [place], [place]. This information is not intended to replace advice given to you by your health care provider. Make sure you discuss any questions you have with your health care provider.

## 2013-10-23 LAB — URINE CULTURE
Colony Count: NO GROWTH
Culture: NO GROWTH

## 2013-10-24 NOTE — ED Provider Notes (Signed)
Medical screening examination/treatment/procedure(s) were performed by non-physician practitioner and as supervising physician I was immediately available for consultation/collaboration.  Leota Jacobsen, MD 10/24/13 1006

## 2015-02-11 ENCOUNTER — Encounter: Payer: Self-pay | Admitting: Internal Medicine

## 2015-03-26 ENCOUNTER — Encounter (HOSPITAL_COMMUNITY): Payer: Self-pay

## 2015-03-26 ENCOUNTER — Emergency Department (HOSPITAL_COMMUNITY)
Admission: EM | Admit: 2015-03-26 | Discharge: 2015-03-26 | Disposition: A | Discharge status: Home / Self Care | Payer: BC Managed Care – PPO | Attending: Emergency Medicine | Admitting: Emergency Medicine

## 2015-03-26 ENCOUNTER — Emergency Department (HOSPITAL_COMMUNITY): Payer: BC Managed Care – PPO

## 2015-03-26 DIAGNOSIS — Z85831 Personal history of malignant neoplasm of soft tissue: Secondary | ICD-10-CM | POA: Insufficient documentation

## 2015-03-26 DIAGNOSIS — E7800 Pure hypercholesterolemia, unspecified: Secondary | ICD-10-CM | POA: Insufficient documentation

## 2015-03-26 DIAGNOSIS — Z87891 Personal history of nicotine dependence: Secondary | ICD-10-CM | POA: Diagnosis not present

## 2015-03-26 DIAGNOSIS — R030 Elevated blood-pressure reading, without diagnosis of hypertension: Secondary | ICD-10-CM | POA: Diagnosis not present

## 2015-03-26 DIAGNOSIS — R109 Unspecified abdominal pain: Secondary | ICD-10-CM

## 2015-03-26 DIAGNOSIS — F909 Attention-deficit hyperactivity disorder, unspecified type: Secondary | ICD-10-CM | POA: Insufficient documentation

## 2015-03-26 DIAGNOSIS — N200 Calculus of kidney: Secondary | ICD-10-CM | POA: Insufficient documentation

## 2015-03-26 DIAGNOSIS — M1990 Unspecified osteoarthritis, unspecified site: Secondary | ICD-10-CM | POA: Diagnosis not present

## 2015-03-26 DIAGNOSIS — K219 Gastro-esophageal reflux disease without esophagitis: Secondary | ICD-10-CM | POA: Diagnosis not present

## 2015-03-26 DIAGNOSIS — Z79899 Other long term (current) drug therapy: Secondary | ICD-10-CM | POA: Diagnosis not present

## 2015-03-26 DIAGNOSIS — Z791 Long term (current) use of non-steroidal anti-inflammatories (NSAID): Secondary | ICD-10-CM | POA: Diagnosis not present

## 2015-03-26 DIAGNOSIS — IMO0001 Reserved for inherently not codable concepts without codable children: Secondary | ICD-10-CM

## 2015-03-26 LAB — URINALYSIS, ROUTINE W REFLEX MICROSCOPIC
Bilirubin Urine: NEGATIVE
Glucose, UA: NEGATIVE mg/dL
Ketones, ur: 15 mg/dL — AB
Leukocytes, UA: NEGATIVE
Nitrite: NEGATIVE
Protein, ur: 30 mg/dL — AB
Specific Gravity, Urine: 1.02 (ref 1.005–1.030)
pH: 6 (ref 5.0–8.0)

## 2015-03-26 LAB — I-STAT CHEM 8, ED
BUN: 23 mg/dL — ABNORMAL HIGH (ref 6–20)
Calcium, Ion: 1.2 mmol/L (ref 1.12–1.23)
Chloride: 105 mmol/L (ref 101–111)
Creatinine, Ser: 0.8 mg/dL (ref 0.44–1.00)
Glucose, Bld: 107 mg/dL — ABNORMAL HIGH (ref 65–99)
HCT: 45 % (ref 36.0–46.0)
Hemoglobin: 15.3 g/dL — ABNORMAL HIGH (ref 12.0–15.0)
Potassium: 3.9 mmol/L (ref 3.5–5.1)
Sodium: 143 mmol/L (ref 135–145)
TCO2: 25 mmol/L (ref 0–100)

## 2015-03-26 LAB — URINE MICROSCOPIC-ADD ON: Bacteria, UA: NONE SEEN

## 2015-03-26 MED ORDER — MORPHINE SULFATE (PF) 4 MG/ML IV SOLN
4.0000 mg | Freq: Once | INTRAVENOUS | Status: AC
  Administered 2015-03-26: 4 mg via INTRAVENOUS
  Filled 2015-03-26: qty 1

## 2015-03-26 MED ORDER — HYDROCODONE-ACETAMINOPHEN 5-325 MG PO TABS: 2.0000 | ORAL_TABLET | Freq: Once | ORAL | Status: DC

## 2015-03-26 MED ORDER — SODIUM CHLORIDE 0.9 % IV BOLUS (SEPSIS)
500.0000 mL | Freq: Once | INTRAVENOUS | Status: AC
  Administered 2015-03-26: 500 mL via INTRAVENOUS

## 2015-03-26 MED ORDER — ONDANSETRON 4 MG PO TBDP
4.0000 mg | ORAL_TABLET | Freq: Once | ORAL | Status: AC
  Administered 2015-03-26: 4 mg via ORAL
  Filled 2015-03-26: qty 1

## 2015-03-26 MED ORDER — HYDROCODONE-ACETAMINOPHEN 5-325 MG PO TABS
1.0000 | ORAL_TABLET | Freq: Once | ORAL | Status: AC | PRN
Start: 2015-03-26 — End: 2015-03-26
  Administered 2015-03-26: 1 via ORAL
  Filled 2015-03-26: qty 1

## 2015-03-26 MED ORDER — NAPROXEN 500 MG PO TABS: 500.0000 mg | ORAL_TABLET | Freq: Two times a day (BID) | ORAL | Status: DC

## 2015-03-26 MED ORDER — ONDANSETRON HCL 4 MG PO TABS: 4.0000 mg | ORAL_TABLET | Freq: Three times a day (TID) | ORAL | Status: DC | PRN

## 2015-03-26 MED ORDER — OXYCODONE-ACETAMINOPHEN 5-325 MG PO TABS: 1.0000 | ORAL_TABLET | ORAL | Status: DC | PRN

## 2015-03-26 NOTE — ED Notes (Signed)
Patient requesting something for pain. PA-C made aware of same.

## 2015-03-26 NOTE — ED Notes (Signed)
Pt has left flank pain starting 2-3 hours ago.  Pt has hx of kidney stones.  Urine is dark.  No fever.  No vomiting.  Nausea present

## 2015-03-26 NOTE — ED Provider Notes (Signed)
CSN: [id number]     Arrival date & time 03/26/15  1404 History   None    Chief Complaint  Patient presents with  . Flank Pain     (Consider location/radiation/quality/duration/timing/severity/associated sxs/prior Treatment) Patient is a 59 y.o. female presenting with flank pain. The history is provided by the patient and medical records. No language interpreter was used.  Flank Pain Associated symptoms include nausea. Pertinent negatives include no abdominal pain, arthralgias, congestion, coughing, headaches, myalgias, neck pain, rash, sore throat, vomiting or weakness.  Jessica Galvan is a 59 y.o. female  who presents to the Emergency Department complaining of acute onset, waxing-waning left flank pain since ~ noon today. Associated symptoms include nausea and left back pain. Denies fever, emesis, and urinary symptoms. No medications tried PTA. Worse with certain positions. She has had one kidney stone in the past - on right - and states this feels similar.   Past Medical History  Diagnosis Date  . Environmental allergies   . Arthritis   . GERD (gastroesophageal reflux disease)   . Hypercholesterolemia   . Cancer ([place]) 2013    LEFT LEG  . ADHD (attention deficit hyperactivity disorder), combined type    Past Surgical History  Procedure Laterality Date  . Cesarean section      2  . Colonoscopy    . Polypectomy     Family History  Problem Relation Age of Onset  . Diabetes Mother   . Heart disease Mother   . Diabetes Father   . Heart disease Father   . Colon cancer Neg Hx   . Rectal cancer Neg Hx   . Stomach cancer Neg Hx   . Esophageal cancer Neg Hx    Social History  Substance Use Topics  . Smoking status: Former Smoker    Quit date: 11/18/2006  . Smokeless tobacco: Never Used  . Alcohol Use: 0.6 oz/week    1 Glasses of wine per week   OB History    No data available     Review of Systems  Constitutional: Negative.   HENT: Negative for congestion, rhinorrhea  and sore throat.   Eyes: Negative for visual disturbance.  Respiratory: Negative for cough, shortness of breath and wheezing.   Cardiovascular: Negative.   Gastrointestinal: Positive for nausea. Negative for vomiting, abdominal pain, diarrhea and constipation.  Genitourinary: Positive for flank pain. Negative for dysuria, urgency and frequency.  Musculoskeletal: Positive for back pain. Negative for myalgias, arthralgias and neck pain.  Skin: Negative for rash.  Neurological: Negative for dizziness, weakness and headaches.      Allergies  Lipitor  Home Medications   Prior to Admission medications   Medication Sig Start Date End Date Taking? Authorizing Provider  amphetamine-dextroamphetamine (ADDERALL XR) 30 MG 24 hr capsule Take 30 mg by mouth every morning.   Yes Historical Provider, MD  diphenhydrAMINE (BENADRYL) 12.5 MG/5ML liquid Take 12.5 mg by mouth at bedtime as needed for allergies.   Yes Historical Provider, MD  lansoprazole (PREVACID) 15 MG capsule Take 15 mg by mouth daily at 12 noon.   Yes Historical Provider, MD  meloxicam (MOBIC) 15 MG tablet Take 15 mg by mouth daily.  10/31/11  Yes Historical Provider, MD  simvastatin (ZOCOR) 40 MG tablet Take 40 mg by mouth daily.  11/09/11  Yes Historical Provider, MD  naproxen (NAPROSYN) 500 MG tablet Take 1 tablet (500 mg total) by mouth 2 (two) times daily. 03/26/15   [place], PA-C  ondansetron [facility]) 4  MG tablet Take 1 tablet (4 mg total) by mouth every 8 (eight) hours as needed for nausea or vomiting. 03/26/15   Ozella Almond Jensyn Shave, PA-C  oxyCODONE-acetaminophen (PERCOCET/ROXICET) 5-325 MG tablet Take 1-2 tablets by mouth every 4 (four) hours as needed for severe pain. 03/26/15   Sonni Barse Pilcher Lupita Rosales, PA-C   BP 157/90 mmHg  Pulse 76  Temp(Src) 97.3 F (36.3 C) (Oral)  Resp 18  SpO2 99% Physical Exam  Constitutional: She is oriented to person, place, and time. She appears well-developed and well-nourished.  Alert and in  no acute distress  HENT:  Head: Normocephalic and atraumatic.  Cardiovascular: Normal rate, regular rhythm, normal heart sounds and intact distal pulses.  Exam reveals no gallop and no friction rub.   No murmur heard. Pulmonary/Chest: Effort normal and breath sounds normal. No respiratory distress. She has no wheezes. She has no rales. She exhibits no tenderness.  Abdominal: She exhibits no mass. There is no rebound and no guarding.    Abdomen soft, non-distended TTP as depicted in image.  Bowel sounds positive in all four quadrants  Musculoskeletal: She exhibits no edema.       Back:  Neurological: She is alert and oriented to person, place, and time.  Skin: Skin is warm and dry. No rash noted.  Psychiatric: She has a normal mood and affect. Her behavior is normal. Judgment and thought content normal.  Nursing note and vitals reviewed.   ED Course  Procedures (including critical care time) Labs Review Labs Reviewed  URINALYSIS, ROUTINE W REFLEX MICROSCOPIC (NOT AT [facility]) - Abnormal; Notable for the following:    APPearance CLOUDY (*)    Hgb urine dipstick LARGE (*)    Ketones, ur 15 (*)    Protein, ur 30 (*)    All other components within normal limits  URINE MICROSCOPIC-ADD ON - Abnormal; Notable for the following:    Squamous Epithelial / LPF 0-5 (*)    All other components within normal limits  I-STAT CHEM 8, ED - Abnormal; Notable for the following:    BUN 23 (*)    Glucose, Bld 107 (*)    Hemoglobin 15.3 (*)    All other components within normal limits    Imaging Review Ct Renal Stone Study  03/26/2015  CLINICAL DATA:  Acute left flank pain. EXAM: CT ABDOMEN AND PELVIS WITHOUT CONTRAST TECHNIQUE: Multidetector CT imaging of the abdomen and pelvis was performed following the standard protocol without IV contrast. COMPARISON:  CT scan of October 21, 2013. FINDINGS: Visualized lung bases are unremarkable. No significant osseous abnormality is noted. No gallstones are noted.  No focal abnormality is noted in the liver, spleen or pancreas on these unenhanced images. Adrenal glands appear normal. Right kidney and ureter appear normal. Mild left hydronephrosis is noted secondary to 3 mm calculus just beyond the left ureteropelvic junction. Atherosclerosis of abdominal aorta is noted without aneurysm formation. There is no evidence of bowel obstruction. The appendix appears normal. No abnormal fluid collection is noted. Uterus appears normal. Ovaries are not well visualized. Urinary bladder is unremarkable. No significant adenopathy is noted. IMPRESSION: Mild left hydronephrosis is noted secondary to 3 mm left ureteral calculus just distal to the left ureteropelvic junction. Electronically Signed   By: Marijo Conception, M.D.   On: 03/26/2015 15:46   I have personally reviewed and evaluated these images and lab results as part of my medical decision-making.   EKG Interpretation None      MDM  Final diagnoses:  Left flank pain  Nephrolithiasis  Elevated blood pressure   Jessica Galvan presents with acute onset, waxing-waning flank pain; has had one kidney stone in the past.  At initial exam, pain is improving - pain meds available PRN if it beings to worsen during stay.   UA with large hgb, 15 ketones, 30 protein CT shows Mild left hydronephrosis 2/2 3 mm left ureteral stone distal to ureteropelvic junction.   Will dc to home with return precautions and urology and PCP follow-up, as well as pain and nausea control.  NIH diet for kidney stone prevention printed and given with discharge instructions.   Patient discussed with Dr. Tamera Punt who agrees with treatment plan.    [facility] Masiyah Engen, PA-C 03/26/15 1822  Malvin Johns, MD 03/27/15 0005

## 2015-03-26 NOTE — Discharge Instructions (Signed)
You have been diagnosed with kidney stones.  Use your pain medication as prescribed and do not operate heavy machinery while on this medication. Note that your pain medication contains Acetaminophen (Tylenol), therefore it is not recommended to take additional Tylenol while on your pain medication. Continue to drink fluids to help you pass the stones. Use Zofran for nausea as directed. Follow up with your primary care doctor in regards to your hospital visit.  Return to the ED immediately if you develop fever that persists > 101, uncontrolled pain or vomiting, or other concerns.  Read the instructions below to learn more about kidney stones.    Kidney Stones Kidney stones (ureteral lithiasis) are solid masses that form inside your kidneys. The intense pain is caused by the stone moving through the kidney, ureter, bladder, and urethra (urinary tract). When the stone moves, the ureter starts to spasm around the stone. The stone is usually passed in the urine.   HOME CARE  Drink enough fluids to keep your urine clear or pale yellow. This helps to get the stone out.   Only take medicine as told by your doctor.   Follow up with your doctor as told.   GET HELP RIGHT AWAY IF:   Your pain does not get better with medicine.   You have a fever.   Your pain increases and gets worse over 18 hours.   You have new belly (abdominal) pain.   You feel faint or pass out.   MAKE SURE YOU:   Understand these instructions.   Will watch your condition.   Will get help right away if you are not doing well or get worse.

## 2015-12-19 ENCOUNTER — Other Ambulatory Visit: Payer: Self-pay | Admitting: Obstetrics and Gynecology

## 2015-12-19 DIAGNOSIS — R928 Other abnormal and inconclusive findings on diagnostic imaging of breast: Secondary | ICD-10-CM

## 2015-12-30 ENCOUNTER — Ambulatory Visit
Admission: RE | Admit: 2015-12-30 | Discharge: 2015-12-30 | Disposition: A | Discharge status: Home / Self Care | Payer: BC Managed Care – PPO | Source: Ambulatory Visit | Attending: Obstetrics and Gynecology | Admitting: Obstetrics and Gynecology

## 2015-12-30 DIAGNOSIS — R928 Other abnormal and inconclusive findings on diagnostic imaging of breast: Secondary | ICD-10-CM

## 2016-09-21 ENCOUNTER — Encounter: Payer: Self-pay | Admitting: Internal Medicine

## 2016-11-11 ENCOUNTER — Encounter: Payer: Self-pay | Admitting: Internal Medicine

## 2016-11-11 ENCOUNTER — Ambulatory Visit (AMBULATORY_SURGERY_CENTER): Payer: Self-pay | Admitting: *Deleted

## 2016-11-11 VITALS — Ht 62.75 in | Wt 165.0 lb

## 2016-11-11 DIAGNOSIS — Z86010 Personal history of colonic polyps: Secondary | ICD-10-CM

## 2016-11-11 DIAGNOSIS — N952 Postmenopausal atrophic vaginitis: Secondary | ICD-10-CM | POA: Insufficient documentation

## 2016-11-11 DIAGNOSIS — R809 Proteinuria, unspecified: Secondary | ICD-10-CM | POA: Insufficient documentation

## 2016-11-11 MED ORDER — NA SULFATE-K SULFATE-MG SULF 17.5-3.13-1.6 GM/177ML PO SOLN: ORAL | 0 refills | Status: DC

## 2016-11-11 NOTE — Progress Notes (Signed)
Patient denies any allergies to eggs or soy. Patient denies any problems with anesthesia/sedation. Patient denies any oxygen use at home and does not take any diet/weight loss medications. EMMI education assisgned to patient on colonoscopy, this was explained and instructions given to patient. 

## 2016-11-20 ENCOUNTER — Ambulatory Visit (AMBULATORY_SURGERY_CENTER): Payer: BC Managed Care – PPO | Admitting: Internal Medicine

## 2016-11-20 ENCOUNTER — Encounter: Payer: Self-pay | Admitting: Internal Medicine

## 2016-11-20 VITALS — BP 127/75 | HR 64 | Temp 98.0°F | Resp 13 | Ht 62.75 in | Wt 165.0 lb

## 2016-11-20 DIAGNOSIS — Z86010 Personal history of colonic polyps: Secondary | ICD-10-CM

## 2016-11-20 DIAGNOSIS — D122 Benign neoplasm of ascending colon: Secondary | ICD-10-CM | POA: Diagnosis not present

## 2016-11-20 MED ORDER — SODIUM CHLORIDE 0.9 % IV SOLN: 500.0000 mL | INTRAVENOUS | Status: DC

## 2016-11-20 NOTE — Patient Instructions (Signed)
YOU HAD AN ENDOSCOPIC PROCEDURE TODAY AT THE [facility]:   Refer to the procedure report that was given to you for any specific questions about what was found during the examination.  If the procedure report does not answer your questions, please call your gastroenterologist to clarify.  If you requested that your care partner not be given the details of your procedure findings, then the procedure report has been included in a sealed envelope for you to review at your convenience later.  YOU SHOULD EXPECT: Some feelings of bloating in the abdomen. Passage of more gas than usual.  Walking can help get rid of the air that was put into your GI tract during the procedure and reduce the bloating. If you had a lower endoscopy (such as a colonoscopy or flexible sigmoidoscopy) you may notice spotting of blood in your stool or on the toilet paper. If you underwent a bowel prep for your procedure, you may not have a normal bowel movement for a few days.  Please Note:  You might notice some irritation and congestion in your nose or some drainage.  This is from the oxygen used during your procedure.  There is no need for concern and it should clear up in a day or so.  SYMPTOMS TO REPORT IMMEDIATELY:   Following lower endoscopy (colonoscopy or flexible sigmoidoscopy):  Excessive amounts of blood in the stool  Significant tenderness or worsening of abdominal pains  Swelling of the abdomen that is new, acute  Fever of 100F or higher   For urgent or emergent issues, a gastroenterologist can be reached at any hour by calling [phone].   DIET:  We do recommend a small meal at first, but then you may proceed to your regular diet.  Drink plenty of fluids but you should avoid alcoholic beverages for 24 hours.  ACTIVITY:  You should plan to take it easy for the rest of today and you should NOT DRIVE or use heavy machinery until tomorrow (because of the sedation medicines used during the test).     FOLLOW UP: Our staff will call the number listed on your records the next business day following your procedure to check on you and address any questions or concerns that you may have regarding the information given to you following your procedure. If we do not reach you, we will leave a message.  However, if you are feeling well and you are not experiencing any problems, there is no need to return our call.  We will assume that you have returned to your regular daily activities without incident.  If any biopsies were taken you will be contacted by phone or by letter within the next 1-3 weeks.  Please call us at [phone] if you have not heard about the biopsies in 3 weeks.    SIGNATURES/CONFIDENTIALITY: You and/or your care partner have signed paperwork which will be entered into your electronic medical record.  These signatures attest to the fact that that the information above on your After Visit Summary has been reviewed and is understood.  Full responsibility of the confidentiality of this discharge information lies with you and/or your care-partner.    Handouts were given to your care partner on polyps and hemorrhoids. You may resume your current medications today. Await biopsy results. Please call if any questions or concerns.   

## 2016-11-20 NOTE — Op Note (Signed)
[place] Patient Name: Jessica Galvan Procedure Date: 11/20/2016 2:51 PM MRN: [id number] Endoscopist: Docia Chuck. Henrene Pastor , MD Age: 61 Referring MD:  Date of Birth: November 12, 1955 Gender: Female Account #: [id number] Procedure:                Colonoscopy, with cold snare polypectomy x 1 Indications:              High risk colon cancer surveillance: Personal                            history of adenoma (10 mm or greater in size), High                            risk colon cancer surveillance: Personal history of                            multiple (3 or more) adenomas. Previous                            examinations 2011 and 2013 Medicines:                Monitored Anesthesia Care Procedure:                Pre-Anesthesia Assessment:                           - Prior to the procedure, a History and Physical                            was performed, and patient medications and                            allergies were reviewed. The patient's tolerance of                            previous anesthesia was also reviewed. The risks                            and benefits of the procedure and the sedation                            options and risks were discussed with the patient.                            All questions were answered, and informed consent                            was obtained. Prior Anticoagulants: The patient has                            taken no previous anticoagulant or antiplatelet                            agents. ASA Grade Assessment: II - A patient with  mild systemic disease. After reviewing the risks                            and benefits, the patient was deemed in                            satisfactory condition to undergo the procedure.                           After obtaining informed consent, the colonoscope                            was passed under direct vision. Throughout the                            procedure, the  patient's blood pressure, pulse, and                            oxygen saturations were monitored continuously. The                            Colonoscope was introduced through the anus and                            advanced to the the cecum, identified by                            appendiceal orifice and ileocecal valve. The                            ileocecal valve, appendiceal orifice, and rectum                            were photographed. The quality of the bowel                            preparation was good. The colonoscopy was performed                            without difficulty. The patient tolerated the                            procedure well. The bowel preparation used was                            SUPREP. Scope In: 3:08:41 PM Scope Out: 3:25:27 PM Scope Withdrawal Time: 0 hours 13 minutes 43 seconds  Total Procedure Duration: 0 hours 16 minutes 46 seconds  Findings:                 A 7 mm polyp was found in the ascending colon. The                            polyp was sessile. The polyp was removed with a  cold snare. Resection and retrieval were complete.                           Internal hemorrhoids were found during retroflexion.                           The exam was otherwise without abnormality on                            direct and retroflexion views. Complications:            No immediate complications. Estimated blood loss:                            None. Estimated Blood Loss:     Estimated blood loss: none. Impression:               - One 7 mm polyp in the ascending colon, removed                            with a cold snare. Resected and retrieved.                           - Internal hemorrhoids.                           - The examination was otherwise normal on direct                            and retroflexion views. Recommendation:           - Repeat colonoscopy in 5 years for surveillance.                           -  Patient has a contact number available for                            emergencies. The signs and symptoms of potential                            delayed complications were discussed with the                            patient. Return to normal activities tomorrow.                            Written discharge instructions were provided to the                            patient.                           - Resume previous diet.                           - Continue present medications.                           -  Await pathology results. Docia Chuck. Henrene Pastor, MD 11/20/2016 3:29:03 PM This report has been signed electronically.

## 2016-11-20 NOTE — Progress Notes (Signed)
No problems noted in the recovery room. maw 

## 2016-11-20 NOTE — Progress Notes (Signed)
To recovery, report to [place], RN, VSS 

## 2016-11-20 NOTE — Progress Notes (Signed)
Pt's states no medical or surgical changes since previsit or office visit. 

## 2016-11-20 NOTE — Progress Notes (Signed)
Called to room to assist during endoscopic procedure.  Patient ID and intended procedure confirmed with present staff. Received instructions for my participation in the procedure from the performing physician.  

## 2016-11-23 ENCOUNTER — Telehealth: Payer: Self-pay

## 2016-11-23 NOTE — Telephone Encounter (Signed)
Called [phone] and left a messaged we tried to reach pt for a follow up call. maw

## 2016-11-24 ENCOUNTER — Telehealth: Payer: Self-pay

## 2016-11-24 NOTE — Telephone Encounter (Signed)
  Follow up Call-  Call back number 11/20/2016  Post procedure Call Back phone  # [phone]  Permission to leave phone message Yes  Some recent data might be hidden     Patient questions:  Do you have a fever, pain , or abdominal swelling? No. Pain Score  0 *  Have you tolerated food without any problems? Yes.    Have you been able to return to your normal activities? Yes.    Do you have any questions about your discharge instructions: Diet   No. Medications  No. Follow up visit  No.  Do you have questions or concerns about your Care? No.  Actions: * If pain score is 4 or above: No action needed, pain <4.

## 2016-11-30 ENCOUNTER — Encounter: Payer: Self-pay | Admitting: Internal Medicine

## 2017-10-15 ENCOUNTER — Other Ambulatory Visit: Payer: Self-pay | Admitting: Obstetrics and Gynecology

## 2017-10-15 DIAGNOSIS — Z1231 Encounter for screening mammogram for malignant neoplasm of breast: Secondary | ICD-10-CM

## 2017-11-05 ENCOUNTER — Ambulatory Visit
Admission: RE | Admit: 2017-11-05 | Discharge: 2017-11-05 | Disposition: A | Discharge status: Home / Self Care | Payer: BC Managed Care – PPO | Source: Ambulatory Visit | Attending: Obstetrics and Gynecology | Admitting: Obstetrics and Gynecology

## 2017-11-05 DIAGNOSIS — Z1231 Encounter for screening mammogram for malignant neoplasm of breast: Secondary | ICD-10-CM

## 2019-01-24 ENCOUNTER — Other Ambulatory Visit (HOSPITAL_COMMUNITY): Payer: Self-pay | Admitting: Nephrology

## 2019-01-24 DIAGNOSIS — N049 Nephrotic syndrome with unspecified morphologic changes: Secondary | ICD-10-CM

## 2019-02-02 ENCOUNTER — Other Ambulatory Visit: Payer: Self-pay | Admitting: Radiology

## 2019-02-02 ENCOUNTER — Other Ambulatory Visit: Payer: Self-pay | Admitting: Physician Assistant

## 2019-02-03 ENCOUNTER — Ambulatory Visit (HOSPITAL_COMMUNITY)
Admission: RE | Admit: 2019-02-03 | Discharge: 2019-02-03 | Disposition: A | Discharge status: Home / Self Care | Payer: BC Managed Care – PPO | Source: Ambulatory Visit | Attending: Nephrology | Admitting: Nephrology

## 2019-02-03 ENCOUNTER — Other Ambulatory Visit: Payer: Self-pay

## 2019-02-03 ENCOUNTER — Encounter (HOSPITAL_COMMUNITY): Payer: Self-pay

## 2019-02-03 DIAGNOSIS — Z85820 Personal history of malignant melanoma of skin: Secondary | ICD-10-CM | POA: Diagnosis not present

## 2019-02-03 DIAGNOSIS — K219 Gastro-esophageal reflux disease without esophagitis: Secondary | ICD-10-CM | POA: Insufficient documentation

## 2019-02-03 DIAGNOSIS — F902 Attention-deficit hyperactivity disorder, combined type: Secondary | ICD-10-CM | POA: Diagnosis not present

## 2019-02-03 DIAGNOSIS — N049 Nephrotic syndrome with unspecified morphologic changes: Secondary | ICD-10-CM

## 2019-02-03 DIAGNOSIS — F419 Anxiety disorder, unspecified: Secondary | ICD-10-CM | POA: Diagnosis not present

## 2019-02-03 DIAGNOSIS — E785 Hyperlipidemia, unspecified: Secondary | ICD-10-CM | POA: Diagnosis not present

## 2019-02-03 DIAGNOSIS — Z87891 Personal history of nicotine dependence: Secondary | ICD-10-CM | POA: Diagnosis not present

## 2019-02-03 DIAGNOSIS — N052 Unspecified nephritic syndrome with diffuse membranous glomerulonephritis: Secondary | ICD-10-CM | POA: Diagnosis not present

## 2019-02-03 DIAGNOSIS — Z79899 Other long term (current) drug therapy: Secondary | ICD-10-CM | POA: Insufficient documentation

## 2019-02-03 LAB — CBC WITH DIFFERENTIAL/PLATELET
Abs Immature Granulocytes: 0.01 10*3/uL (ref 0.00–0.07)
Basophils Absolute: 0.1 10*3/uL (ref 0.0–0.1)
Basophils Relative: 1 %
Eosinophils Absolute: 0.2 10*3/uL (ref 0.0–0.5)
Eosinophils Relative: 5 %
HCT: 44.4 % (ref 36.0–46.0)
Hemoglobin: 14.8 g/dL (ref 12.0–15.0)
Immature Granulocytes: 0 %
Lymphocytes Relative: 45 %
Lymphs Abs: 2.1 10*3/uL (ref 0.7–4.0)
MCH: 31.4 pg (ref 26.0–34.0)
MCHC: 33.3 g/dL (ref 30.0–36.0)
MCV: 94.1 fL (ref 80.0–100.0)
Monocytes Absolute: 0.4 10*3/uL (ref 0.1–1.0)
Monocytes Relative: 9 %
Neutro Abs: 1.9 10*3/uL (ref 1.7–7.7)
Neutrophils Relative %: 40 %
Platelets: 275 10*3/uL (ref 150–400)
RBC: 4.72 MIL/uL (ref 3.87–5.11)
RDW: 13.1 % (ref 11.5–15.5)
WBC: 4.8 10*3/uL (ref 4.0–10.5)
nRBC: 0 % (ref 0.0–0.2)

## 2019-02-03 LAB — PROTIME-INR
INR: 0.9 (ref 0.8–1.2)
Prothrombin Time: 12.1 seconds (ref 11.4–15.2)

## 2019-02-03 MED ORDER — MIDAZOLAM HCL 2 MG/2ML IJ SOLN
INTRAMUSCULAR | Status: AC | PRN
  Administered 2019-02-03: 1 mg via INTRAVENOUS

## 2019-02-03 MED ORDER — LIDOCAINE HCL (PF) 1 % IJ SOLN
INTRAMUSCULAR | Status: AC
  Filled 2019-02-03: qty 30

## 2019-02-03 MED ORDER — MIDAZOLAM HCL 2 MG/2ML IJ SOLN
INTRAMUSCULAR | Status: AC
  Filled 2019-02-03: qty 2

## 2019-02-03 MED ORDER — FENTANYL CITRATE (PF) 100 MCG/2ML IJ SOLN
INTRAMUSCULAR | Status: AC
  Filled 2019-02-03: qty 2

## 2019-02-03 MED ORDER — GELATIN ABSORBABLE 12-7 MM EX MISC
CUTANEOUS | Status: AC
  Filled 2019-02-03: qty 1

## 2019-02-03 MED ORDER — FENTANYL CITRATE (PF) 100 MCG/2ML IJ SOLN
INTRAMUSCULAR | Status: AC | PRN
  Administered 2019-02-03: 50 ug via INTRAVENOUS

## 2019-02-03 MED ORDER — SODIUM CHLORIDE 0.9 % IV SOLN: INTRAVENOUS | Status: DC

## 2019-02-03 NOTE — Procedures (Signed)
Interventional Radiology Procedure Note  Procedure: US guided left medical renal biopsy.  Complications: None Recommendations:  - Ok to shower tomorrow - Do not submerge for 7 days - Routine care   Signed,  Lamarkus Nebel S. Beckham Buxbaum, DO    

## 2019-02-03 NOTE — Discharge Instructions (Signed)

## 2019-02-03 NOTE — H&P (Signed)
Chief Complaint: Jessica Galvan was seen in consultation today for a random renal biopsy.  Referring Physician(s): Upton,Elizabeth  Supervising Physician: Corrie Mckusick  Jessica Galvan Status: [facility] - Out-pt  History of Present Illness: Jessica Galvan is a 63 y.o. female with a past medical history significant for ADHD, anxiety, arthritis, skin cancer, GERD and HLD who was recently seen by Dr. Hollie Salk (nephrology) due to proteinuria which began in August of this year when Jessica Galvan began to experience gross hematuria and foamy/frothy urine. Jessica Galvan states Jessica Galvan has no history of kidney issues except for several kidney stones in the past but her brother does have membranous glomerulonephritis and is currently being treated with medication and diet changes in [place]. Additionally, Jessica Galvan reported that her cholesterol suddenly increased in May of this year and Jessica Galvan began taking a statin. Jessica Galvan also has had some lower extremity edema. Given her family history of membranous glomerulonephritis and Jessica Galvan with nephrotic syndrome clinically, a request has been made to IR for a random renal biopsy for further evaluation.  Jessica Galvan states Jessica Galvan has been feeling well, Jessica Galvan has recently started intermittent fasting and limiting her carbs which has caused her to lose some weight which Jessica Galvan is happy about. Jessica Galvan reports continued frothy urine, but has not noted any blood in her urine recently. Jessica Galvan is nervous about the biopsy but is looking forward to getting the results so Jessica Galvan can find out what is going on. Jessica Galvan has a follow up appointment with Dr. Hollie Salk on Wednesday.   Past Medical History:  Diagnosis Date  . ADHD (attention deficit hyperactivity disorder), combined type   . Arthritis   . Cancer ([place]) 2013   LEFT LEG-skin cancer melanoma  . Environmental allergies   . GERD (gastroesophageal reflux disease)   . Hypercholesterolemia     Past Surgical History:  Procedure Laterality Date  . CESAREAN SECTION     2  . COLONOSCOPY    .  POLYPECTOMY      Allergies: Lipitor [atorvastatin] and Prednisone  Medications: Prior to Admission medications   Medication Sig Start Date End Date Taking? Authorizing Provider  amphetamine-dextroamphetamine (ADDERALL XR) 30 MG 24 hr capsule Take 30 mg by mouth every morning.   Yes [provider]  diphenhydramine-acetaminophen (TYLENOL PM) 25-500 MG TABS tablet Take 1 tablet by mouth at bedtime.   Yes [provider]  omeprazole (PRILOSEC) 20 MG capsule Take 20 mg by mouth daily.   Yes [provider]  rosuvastatin (CRESTOR) 5 MG tablet Take 5 mg by mouth daily.   Yes [provider]  ALPRAZolam Duanne Moron) 0.5 MG tablet Take 0.5 tablets by mouth daily as needed for anxiety.     [provider]     Family History  Problem Relation Age of Onset  . Diabetes Mother   . Heart disease Mother   . Diabetes Father   . Heart disease Father   . Colon cancer Neg Hx   . Rectal cancer Neg Hx   . Stomach cancer Neg Hx   . Esophageal cancer Neg Hx     Social History   Socioeconomic History  . Marital status: Married    Spouse name: Not on file  . Number of children: Not on file  . Years of education: Not on file  . Highest education level: Not on file  Occupational History  . Not on file  Social Needs  . Financial resource strain: Not on file  . Food insecurity    Worry: Not on  file    Inability: Not on file  . Transportation needs    Medical: Not on file    Non-medical: Not on file  Tobacco Use  . Smoking status: Former Smoker    Quit date: 11/18/2006    Years since quitting: 12.2  . Smokeless tobacco: Never Used  Substance and Sexual Activity  . Alcohol use: Yes    Alcohol/week: 1.0 standard drinks    Types: 1 Glasses of wine per week  . Drug use: No  . Sexual activity: Not on file  Lifestyle  . Physical activity    Days per week: Not on file    Minutes per session: Not on file  . Stress: Not on file  Relationships  . Social  Herbalist on phone: Not on file    Gets together: Not on file    Attends religious service: Not on file    Active member of club or organization: Not on file    Attends meetings of clubs or organizations: Not on file    Relationship status: Not on file  Other Topics Concern  . Not on file  Social History Narrative  . Not on file     Review of Systems: A 12 point ROS discussed and pertinent positives are indicated in the HPI above.  All other systems are negative.  Review of Systems  Constitutional: Negative for chills and fever.  Respiratory: Negative for cough and shortness of breath.   Cardiovascular: Negative for chest pain.  Gastrointestinal: Negative for abdominal pain, blood in stool, diarrhea, nausea and vomiting.  Genitourinary: Negative for dysuria and hematuria.       (+) foamy urine  Musculoskeletal: Negative for back pain.  Skin: Negative for rash.  Neurological: Negative for dizziness and headaches.    Vital Signs: BP (!) 154/94   Pulse 68   Temp 97.6 F (36.4 C) (Oral)   Resp 16   Ht 5\' 2"  (1.575 m)   Wt 143 lb (64.9 kg)   SpO2 100%   BMI 26.16 kg/m   Physical Exam Vitals signs reviewed.  Constitutional:      General: Jessica Galvan is not in acute distress. HENT:     Mouth/Throat:     Mouth: Mucous membranes are moist.     Pharynx: Oropharynx is clear. No oropharyngeal exudate or posterior oropharyngeal erythema.  Cardiovascular:     Rate and Rhythm: Normal rate and regular rhythm.  Pulmonary:     Effort: Pulmonary effort is normal.     Breath sounds: Normal breath sounds.  Abdominal:     General: There is no distension.     Palpations: Abdomen is soft.     Tenderness: There is no abdominal tenderness.  Skin:    General: Skin is warm and dry.  Neurological:     Mental Status: Jessica Galvan is alert and oriented to person, place, and time.  Psychiatric:        Mood and Affect: Mood normal.        Behavior: Behavior normal.        Thought Content:  Thought content normal.        Judgment: Judgment normal.      MD Evaluation Airway: WNL Heart: WNL Abdomen: WNL Chest/ Lungs: WNL ASA  Classification: 2 Mallampati/Airway Score: One   Imaging: No results found.  Labs:  CBC: Recent Labs    02/03/19 0615  WBC 4.8  HGB 14.8  HCT 44.4  PLT 275    COAGS:  Recent Labs    02/03/19 0615  INR 0.9    BMP: No results for input(s): NA, K, CL, CO2, GLUCOSE, BUN, CALCIUM, CREATININE, GFRNONAA, GFRAA in the last 8760 hours.  Invalid input(s): CMP  LIVER FUNCTION TESTS: No results for input(s): BILITOT, AST, ALT, ALKPHOS, PROT, ALBUMIN in the last 8760 hours.  TUMOR MARKERS: No results for input(s): AFPTM, CEA, CA199, CHROMGRNA in the last 8760 hours.  Assessment and Plan:  63 y/o F with family history (brother) of membranous glomerulonephritis who recently began experiencing gross hematuria and frothy urine in August of this year and was seen by nephrology on 01/23/19 who has requested a percutaneous random renal biopsy for which Jessica Galvan presents today.  Jessica Galvan has been NPO since 10 pm yesterday, Jessica Galvan does not take blood thinning medications. Afebrile, WBC 4.8, hgb 14.8, plt 275, INR 0.9.  Risks and benefits of random renal biopsy was discussed with the Jessica Galvan and/or Jessica Galvan's family including, but not limited to bleeding, infection, damage to adjacent structures or low yield requiring additional tests.  All of the questions were answered and there is agreement to proceed.  Consent signed and in chart.  Thank you for this interesting consult.  I greatly enjoyed meeting DENISSE LEHR and look forward to participating in their care.  A copy of this report was sent to the requesting provider on this date.  Electronically Signed: Joaquim Nam, PA-C 02/03/2019, 7:18 AM   I spent a total of  15 Minutes in face to face in clinical consultation, greater than 50% of which was counseling/coordinating care for random renal  biopsy.

## 2019-02-06 ENCOUNTER — Other Ambulatory Visit: Payer: Self-pay | Admitting: Obstetrics and Gynecology

## 2019-02-06 DIAGNOSIS — Z1231 Encounter for screening mammogram for malignant neoplasm of breast: Secondary | ICD-10-CM

## 2019-02-10 ENCOUNTER — Encounter (HOSPITAL_COMMUNITY): Payer: Self-pay | Admitting: Nephrology

## 2019-02-10 LAB — SURGICAL PATHOLOGY

## 2019-02-23 ENCOUNTER — Other Ambulatory Visit: Payer: Self-pay | Admitting: Nephrology

## 2019-02-23 DIAGNOSIS — Z122 Encounter for screening for malignant neoplasm of respiratory organs: Secondary | ICD-10-CM

## 2019-02-27 ENCOUNTER — Other Ambulatory Visit: Payer: Self-pay | Admitting: Nephrology

## 2019-02-27 DIAGNOSIS — Z122 Encounter for screening for malignant neoplasm of respiratory organs: Secondary | ICD-10-CM

## 2019-03-01 ENCOUNTER — Inpatient Hospital Stay: Admission: RE | Admit: 2019-03-01 | Payer: BC Managed Care – PPO | Source: Ambulatory Visit

## 2019-03-01 ENCOUNTER — Ambulatory Visit
Admission: RE | Admit: 2019-03-01 | Discharge: 2019-03-01 | Disposition: A | Discharge status: Home / Self Care | Payer: BC Managed Care – PPO | Source: Ambulatory Visit | Attending: Nephrology | Admitting: Nephrology

## 2019-03-01 DIAGNOSIS — Z122 Encounter for screening for malignant neoplasm of respiratory organs: Secondary | ICD-10-CM

## 2019-03-06 ENCOUNTER — Ambulatory Visit: Payer: BC Managed Care – PPO

## 2019-04-05 ENCOUNTER — Ambulatory Visit
Admission: RE | Admit: 2019-04-05 | Discharge: 2019-04-05 | Disposition: A | Discharge status: Home / Self Care | Payer: BC Managed Care – PPO | Source: Ambulatory Visit | Attending: Obstetrics and Gynecology | Admitting: Obstetrics and Gynecology

## 2019-04-05 ENCOUNTER — Other Ambulatory Visit: Payer: Self-pay

## 2019-04-05 DIAGNOSIS — Z1231 Encounter for screening mammogram for malignant neoplasm of breast: Secondary | ICD-10-CM

## 2019-09-22 ENCOUNTER — Other Ambulatory Visit: Payer: Self-pay | Admitting: Nephrology

## 2019-10-12 ENCOUNTER — Other Ambulatory Visit: Payer: Self-pay | Admitting: Nephrology

## 2019-10-12 DIAGNOSIS — R911 Solitary pulmonary nodule: Secondary | ICD-10-CM

## 2019-10-20 ENCOUNTER — Ambulatory Visit
Admission: RE | Admit: 2019-10-20 | Discharge: 2019-10-20 | Disposition: A | Discharge status: Home / Self Care | Payer: BC Managed Care – PPO | Source: Ambulatory Visit | Attending: Nephrology | Admitting: Nephrology

## 2019-10-20 DIAGNOSIS — R911 Solitary pulmonary nodule: Secondary | ICD-10-CM

## 2019-10-25 ENCOUNTER — Other Ambulatory Visit: Payer: BC Managed Care – PPO

## 2020-05-08 ENCOUNTER — Other Ambulatory Visit: Payer: Self-pay | Admitting: Obstetrics and Gynecology

## 2020-05-08 DIAGNOSIS — Z1231 Encounter for screening mammogram for malignant neoplasm of breast: Secondary | ICD-10-CM

## 2020-06-20 ENCOUNTER — Ambulatory Visit
Admission: RE | Admit: 2020-06-20 | Discharge: 2020-06-20 | Disposition: A | Discharge status: Home / Self Care | Payer: BC Managed Care – PPO | Source: Ambulatory Visit | Attending: Obstetrics and Gynecology | Admitting: Obstetrics and Gynecology

## 2020-06-20 ENCOUNTER — Other Ambulatory Visit: Payer: Self-pay

## 2020-06-20 DIAGNOSIS — Z1231 Encounter for screening mammogram for malignant neoplasm of breast: Secondary | ICD-10-CM

## 2020-09-27 IMAGING — CT CT CHEST LUNG CANCER SCREENING LOW DOSE W/O CM
2 of 5 series · 15 of 40 positions shown, 18 images · non-contrast
Comparison: None

CLINICAL DATA: Thirty pack-year smoking history. Former
asymptomatic smoker.

EXAM:
CT CHEST WITHOUT CONTRAST LOW-DOSE FOR LUNG CANCER SCREENING
TECHNIQUE: Multidetector CT imaging of the chest was performed following the
standard protocol without IV contrast.

[Series 4: lung 1.00 br44 cor · coronal · 0.53mm/px · 3 of 270 slices shown]
[im 54/270  lung]
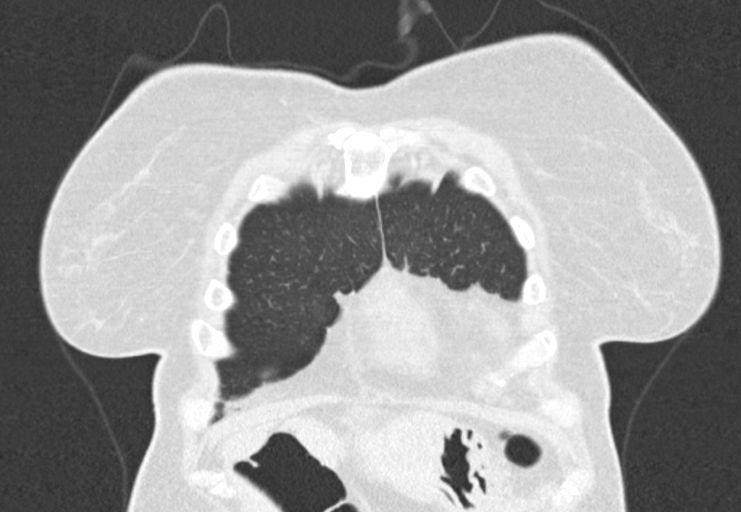
[im 108/270  lung]
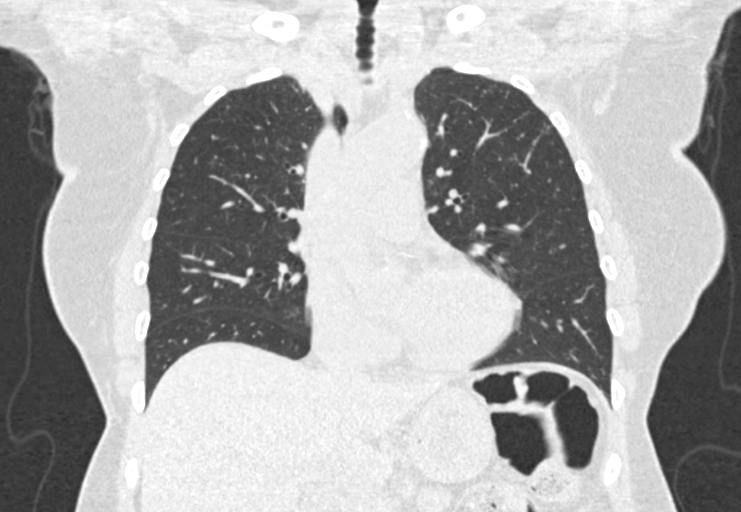
[im 162/270  lung]
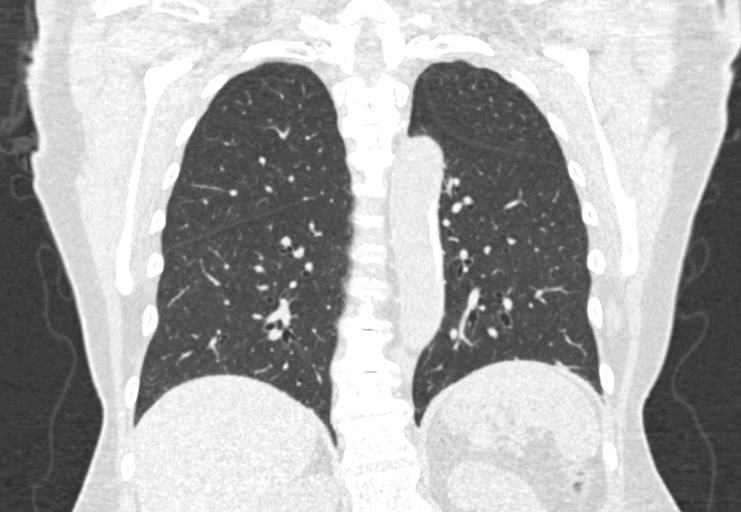

[Series 9: lung 1.00 br60 axial · axial · 0.65mm/px · z∈[-1137,-890]mm · 12 of 273 slices shown, 15 images]
[im 13/273  mediastinal]
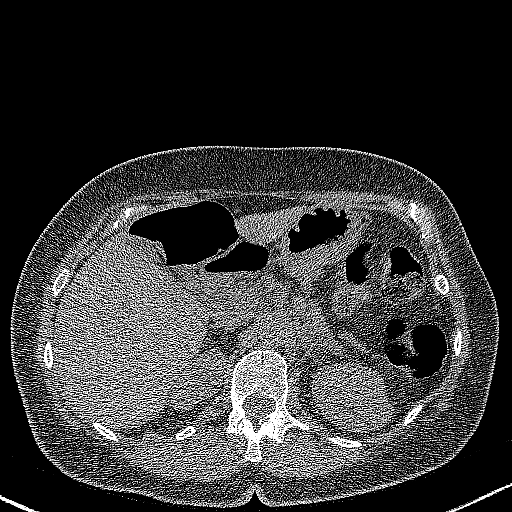
[im 13/273  lung]
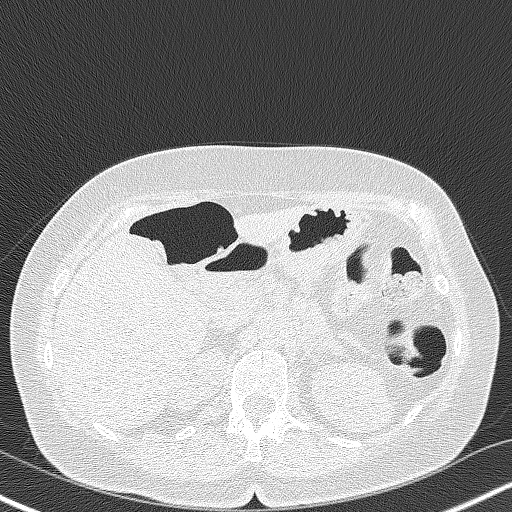
[im 39/273  lung]
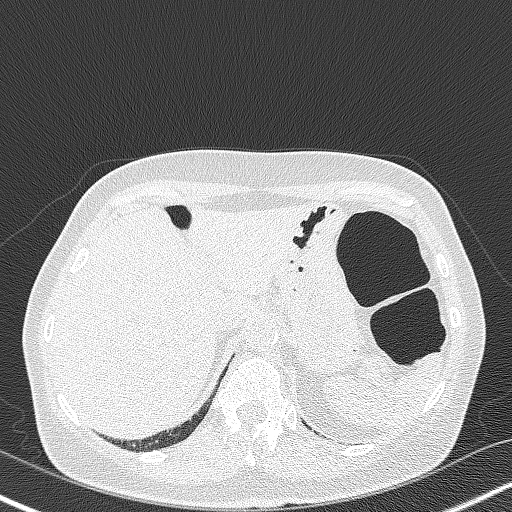
[im 65/273  lung]
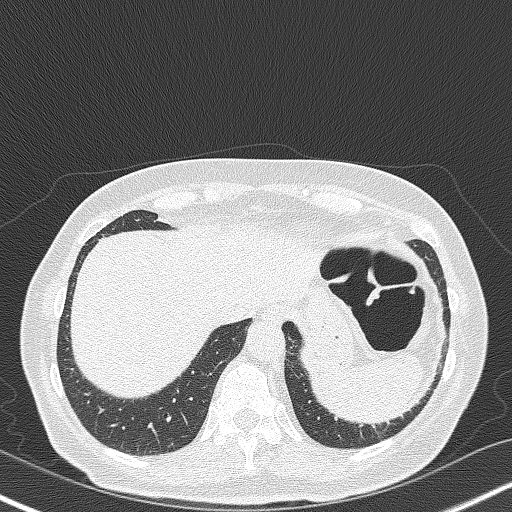
[im 78/273  lung]
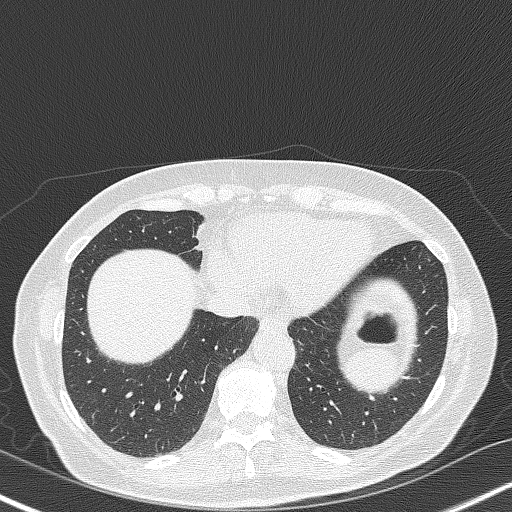
[im 104/273  mediastinal]
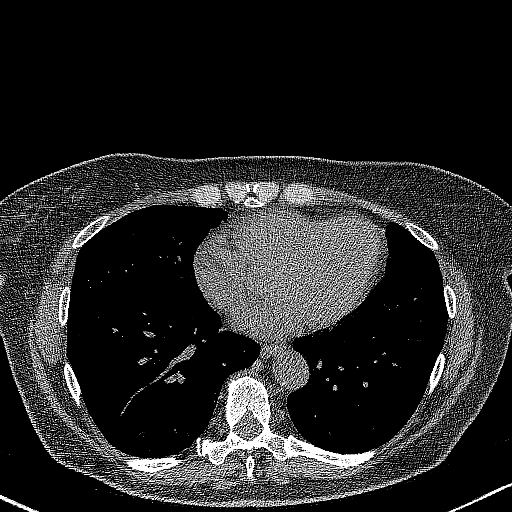
[im 104/273  lung]
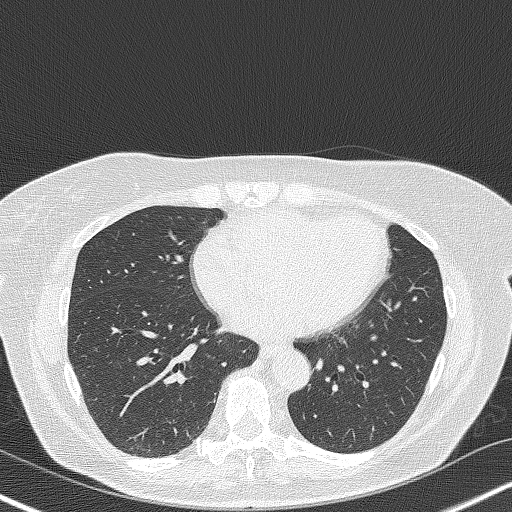
[im 130/273  lung]
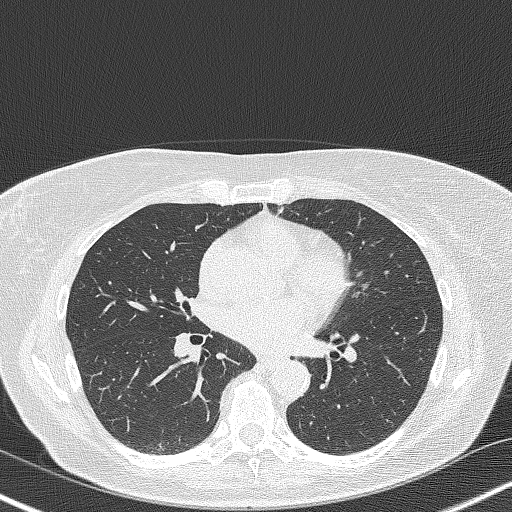
[im 143/273  lung]
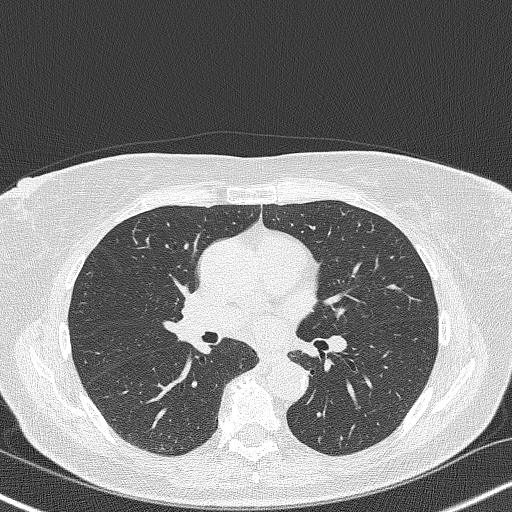
[im 169/273  lung]
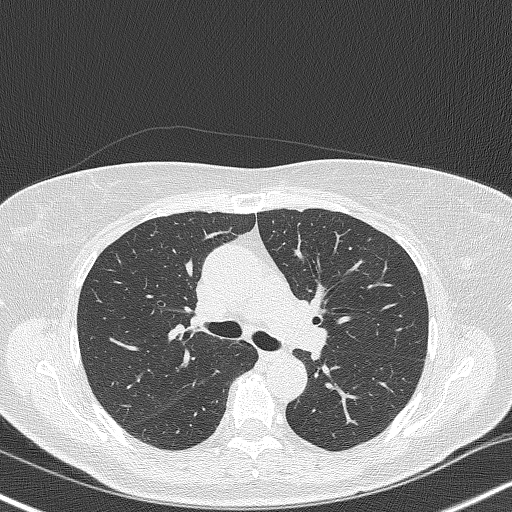
[im 195/273  mediastinal]
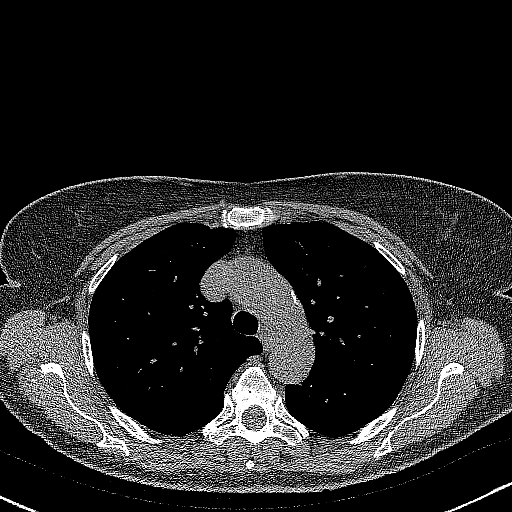
[im 195/273  lung]
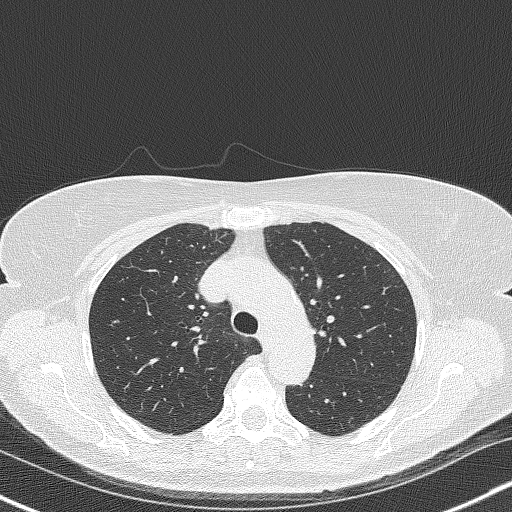
[im 208/273  lung]
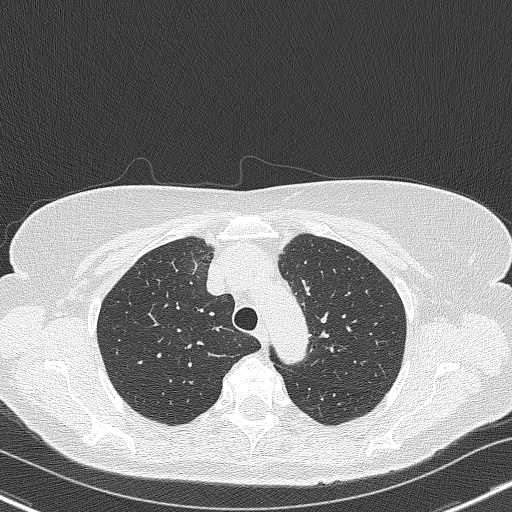
[im 234/273  lung]
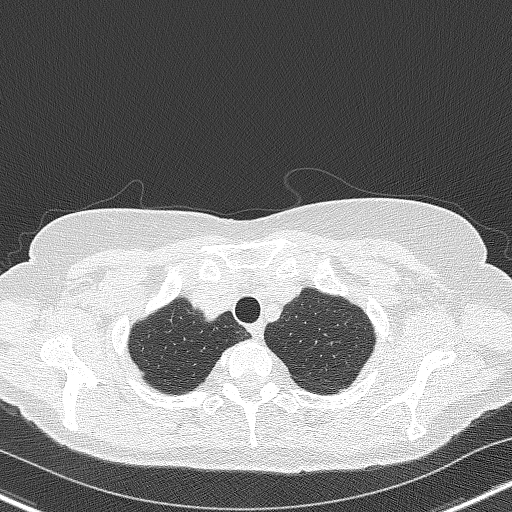
[im 260/273  lung]
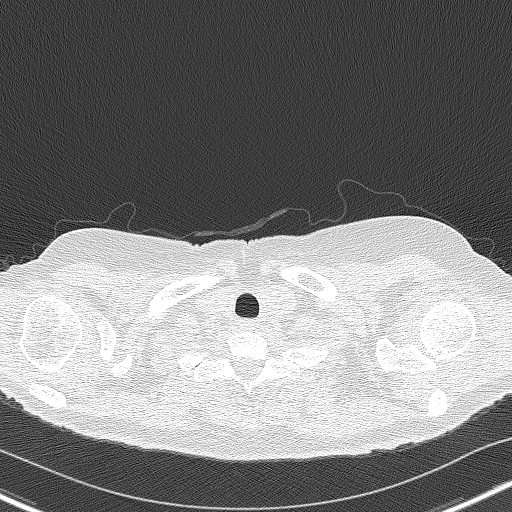

[15 of 40 positions shown; findings below may reference images not displayed]

FINDINGS: Cardiovascular: Heart size is normal. Aortic atherosclerosis. Lad
coronary artery calcification.

Mediastinum/Nodes: No enlarged mediastinal, hilar, or axillary lymph
nodes. Thyroid gland, trachea, and esophagus demonstrate no
significant findings.

Lungs/Pleura: No pleural effusion. No airspace consolidation,
atelectasis, or pneumothorax. Mild changes of emphysema. There is a
noncalcified subpleural nodule in the posterior right lower lobe
with an equivalent diameter of 7 mm. Calcified granuloma is
identified within the left upper lobe.

Upper Abdomen: No acute abnormality.

Musculoskeletal: No chest wall mass or suspicious bone lesions
identified.
IMPRESSION: Lung-RADS 3, probably benign findings. Short-term follow-up in 6
months is recommended with repeat low-dose chest CT without contrast
(please use the following order, "CT CHEST LCS NODULE FOLLOW-UP W/O
CM").

Coronary artery calcifications

Aortic Atherosclerosis (TC3D9-DXX.X).

## 2021-10-01 ENCOUNTER — Other Ambulatory Visit: Payer: Self-pay | Admitting: Obstetrics and Gynecology

## 2021-10-01 DIAGNOSIS — Z1231 Encounter for screening mammogram for malignant neoplasm of breast: Secondary | ICD-10-CM

## 2021-10-07 ENCOUNTER — Ambulatory Visit
Admission: RE | Admit: 2021-10-07 | Discharge: 2021-10-07 | Disposition: A | Discharge status: Home / Self Care | Payer: Medicare PPO | Source: Ambulatory Visit | Attending: Obstetrics and Gynecology | Admitting: Obstetrics and Gynecology

## 2021-10-07 DIAGNOSIS — Z1231 Encounter for screening mammogram for malignant neoplasm of breast: Secondary | ICD-10-CM

## 2021-10-16 ENCOUNTER — Other Ambulatory Visit: Payer: Self-pay | Admitting: Family Medicine

## 2021-10-16 DIAGNOSIS — E2839 Other primary ovarian failure: Secondary | ICD-10-CM

## 2021-10-16 DIAGNOSIS — I739 Peripheral vascular disease, unspecified: Secondary | ICD-10-CM

## 2021-10-27 ENCOUNTER — Ambulatory Visit
Admission: RE | Admit: 2021-10-27 | Discharge: 2021-10-27 | Disposition: A | Discharge status: Home / Self Care | Payer: Medicare PPO | Source: Ambulatory Visit | Attending: Family Medicine | Admitting: Family Medicine

## 2021-10-27 DIAGNOSIS — I739 Peripheral vascular disease, unspecified: Secondary | ICD-10-CM

## 2021-10-28 ENCOUNTER — Encounter: Payer: Self-pay | Admitting: Internal Medicine

## 2021-11-20 ENCOUNTER — Ambulatory Visit (AMBULATORY_SURGERY_CENTER): Payer: Self-pay | Admitting: *Deleted

## 2021-11-20 VITALS — Ht 63.0 in | Wt 127.2 lb

## 2021-11-20 DIAGNOSIS — Z86010 Personal history of colonic polyps: Secondary | ICD-10-CM

## 2021-11-20 MED ORDER — NA SULFATE-K SULFATE-MG SULF 17.5-3.13-1.6 GM/177ML PO SOLN: 1.0000 | Freq: Once | ORAL | 0 refills | Status: AC

## 2021-11-20 MED ORDER — ONDANSETRON HCL 4 MG PO TABS: ORAL_TABLET | ORAL | 0 refills | Status: AC

## 2021-11-20 NOTE — Progress Notes (Signed)
No egg or soy allergy known to patient  No issues known to pt with past sedation with any surgeries or procedures Patient denies ever being told they had issues or difficulty with intubation  No FH of Malignant Hyperthermia Pt is not on diet pills Pt is not on  home 02  Pt is not on blood thinners  Pt denies issues with constipation  No A fib or A flutter Have any cardiac testing pending--no Pt instructed to use [employer] or GoodRx for a price reduction on prep    Pt. Declined golytely prep stated "I can't do that", suprep prep sent in per pt. Request,she stated let me go to [facility] and see and  if I need to " I'll call back to talk to you,zofran also sent in to take 30-60 prior to each prep dose.

## 2021-12-04 ENCOUNTER — Encounter: Payer: Self-pay | Admitting: Internal Medicine

## 2021-12-11 ENCOUNTER — Encounter: Payer: Self-pay | Admitting: Internal Medicine

## 2021-12-11 ENCOUNTER — Ambulatory Visit (AMBULATORY_SURGERY_CENTER): Payer: Medicare PPO | Admitting: Internal Medicine

## 2021-12-11 VITALS — BP 120/73 | HR 62 | Temp 97.5°F | Resp 10 | Ht 62.75 in | Wt 127.2 lb

## 2021-12-11 DIAGNOSIS — Z86010 Personal history of colonic polyps: Secondary | ICD-10-CM | POA: Diagnosis not present

## 2021-12-11 DIAGNOSIS — Z09 Encounter for follow-up examination after completed treatment for conditions other than malignant neoplasm: Secondary | ICD-10-CM | POA: Diagnosis not present

## 2021-12-11 MED ORDER — SODIUM CHLORIDE 0.9 % IV SOLN: 500.0000 mL | Freq: Once | INTRAVENOUS | Status: DC

## 2021-12-11 NOTE — Progress Notes (Signed)
HISTORY OF PRESENT ILLNESS:  Jessica Galvan is a 66 y.o. female with a history of multiple and advanced adenomatous colon polyps.  Presents today for surveillance colonoscopy.  No active complaints  REVIEW OF SYSTEMS:  All non-GI ROS negative.  Past Medical History:  Diagnosis Date   ADHD (attention deficit hyperactivity disorder), combined type    Anxiety    Arthritis    Cancer ([place]) 2013   LEFT LEG-skin cancer melanoma   Chronic kidney disease    membranous neuropathy   Environmental allergies    GERD (gastroesophageal reflux disease)    Hypercholesterolemia    Hypertension     Past Surgical History:  Procedure Laterality Date   BREAST BIOPSY Right    CESAREAN SECTION     2   COLONOSCOPY     POLYPECTOMY     RENAL BIOPSY, OPEN      Social History Brenda BRIENNE LIGUORI  reports that she quit smoking about 15 years ago. Her smoking use included cigarettes. She has never been exposed to tobacco smoke. She has never used smokeless tobacco. She reports current alcohol use of about 1.0 standard drink of alcohol per week. She reports that she does not use drugs.  family history includes Diabetes in her father and mother; Heart disease in her father and mother.  Allergies  Allergen Reactions   Lipitor [Atorvastatin] Other (See Comments)    Myalgias; " severe leg cramps"   Prednisone Other (See Comments)    Pt prefers not to take because of how it makes her feel        PHYSICAL EXAMINATION: Vital signs: BP (!) 137/95   Pulse 67   Temp (!) 97.5 F (36.4 C)   Ht 5' 2.75" (1.594 m)   Wt 127 lb 3.2 oz (57.7 kg)   SpO2 99%   BMI 22.71 kg/m  General: Well-developed, well-nourished, no acute distress HEENT: Sclerae are anicteric, conjunctiva pink. Oral mucosa intact Lungs: Clear Heart: Regular Abdomen: soft, nontender, nondistended, no obvious ascites, no peritoneal signs, normal bowel sounds. No organomegaly. Extremities: No edema Psychiatric: alert and oriented x3.  Cooperative      ASSESSMENT:  Personal history of multiple advanced adenomatous colon polyps   PLAN:   Surveillance colonoscopy

## 2021-12-11 NOTE — Patient Instructions (Signed)
Resume previous diet and medications. Repeat Colonoscopy in 5 years for surveillance.  YOU HAD AN ENDOSCOPIC PROCEDURE TODAY AT THE [facility]:   Refer to the procedure report that was given to you for any specific questions about what was found during the examination.  If the procedure report does not answer your questions, please call your gastroenterologist to clarify.  If you requested that your care partner not be given the details of your procedure findings, then the procedure report has been included in a sealed envelope for you to review at your convenience later.  YOU SHOULD EXPECT: Some feelings of bloating in the abdomen. Passage of more gas than usual.  Walking can help get rid of the air that was put into your GI tract during the procedure and reduce the bloating. If you had a lower endoscopy (such as a colonoscopy or flexible sigmoidoscopy) you may notice spotting of blood in your stool or on the toilet paper. If you underwent a bowel prep for your procedure, you may not have a normal bowel movement for a few days.  Please Note:  You might notice some irritation and congestion in your nose or some drainage.  This is from the oxygen used during your procedure.  There is no need for concern and it should clear up in a day or so.  SYMPTOMS TO REPORT IMMEDIATELY:  Following lower endoscopy (colonoscopy or flexible sigmoidoscopy):  Excessive amounts of blood in the stool  Significant tenderness or worsening of abdominal pains  Swelling of the abdomen that is new, acute  Fever of 100F or higher  For urgent or emergent issues, a gastroenterologist can be reached at any hour by calling [phone]. Do not use MyChart messaging for urgent concerns.    DIET:  We do recommend a small meal at first, but then you may proceed to your regular diet.  Drink plenty of fluids but you should avoid alcoholic beverages for 24 hours.  ACTIVITY:  You should plan to take it easy for the  rest of today and you should NOT DRIVE or use heavy machinery until tomorrow (because of the sedation medicines used during the test).    FOLLOW UP: Our staff will call the number listed on your records the next business day following your procedure.  We will call around 7:15- 8:00 am to check on you and address any questions or concerns that you may have regarding the information given to you following your procedure. If we do not reach you, we will leave a message.  If you develop any symptoms (ie: fever, flu-like symptoms, shortness of breath, cough etc.) before then, please call [phone].  If you test positive for Covid 19 in the 2 weeks post procedure, please call and report this information to us.    If any biopsies were taken you will be contacted by phone or by letter within the next 1-3 weeks.  Please call us at [phone] if you have not heard about the biopsies in 3 weeks.    SIGNATURES/CONFIDENTIALITY: You and/or your care partner have signed paperwork which will be entered into your electronic medical record.  These signatures attest to the fact that that the information above on your After Visit Summary has been reviewed and is understood.  Full responsibility of the confidentiality of this discharge information lies with you and/or your care-partner.  

## 2021-12-11 NOTE — Op Note (Signed)
[place] Patient Name: Jessica Galvan Procedure Date: 12/11/2021 7:13 AM MRN: [id number] Endoscopist: Docia Chuck. Henrene Pastor , MD Age: 66 Referring MD:  Date of Birth: [birth year] Gender: Female Account #: [id number] Procedure:                Colonoscopy Indications:              High risk colon cancer surveillance: Personal                            history of adenoma (10 mm or greater in size), High                            risk colon cancer surveillance: Personal history of                            multiple (3 or more) adenomas. Previous                            examinations 2010, 2013, 2018 Medicines:                Monitored Anesthesia Care Procedure:                Pre-Anesthesia Assessment:                           - Prior to the procedure, a History and Physical                            was performed, and patient medications and                            allergies were reviewed. The patient's tolerance of                            previous anesthesia was also reviewed. The risks                            and benefits of the procedure and the sedation                            options and risks were discussed with the patient.                            All questions were answered, and informed consent                            was obtained. Prior Anticoagulants: The patient has                            taken no previous anticoagulant or antiplatelet                            agents. ASA Grade Assessment: II - A patient with  mild systemic disease. After reviewing the risks                            and benefits, the patient was deemed in                            satisfactory condition to undergo the procedure.                           After obtaining informed consent, the colonoscope                            was passed under direct vision. Throughout the                            procedure, the patient's blood pressure, pulse,  and                            oxygen saturations were monitored continuously. The                            CF [id number] #[id number] was introduced through the anus                            and advanced to the the cecum, identified by                            appendiceal orifice and ileocecal valve. The                            ileocecal valve, appendiceal orifice, and rectum                            were photographed. The quality of the bowel                            preparation was good. The colonoscopy was performed                            without difficulty. The patient tolerated the                            procedure well. The bowel preparation used was                            SUPREP via split dose instruction. Scope In: 8:28:19 AM Scope Out: 8:40:30 AM Scope Withdrawal Time: 0 hours 8 minutes 6 seconds  Total Procedure Duration: 0 hours 12 minutes 11 seconds  Findings:                 Internal hemorrhoids were found during retroflexion.                           The exam was otherwise without abnormality on  direct and retroflexion views. Complications:            No immediate complications. Estimated blood loss:                            None. Estimated Blood Loss:     Estimated blood loss: none. Impression:               - Internal hemorrhoids.                           - The examination was otherwise normal on direct                            and retroflexion views.                           - No specimens collected. Recommendation:           - Repeat colonoscopy in 5 years for surveillance                            (personal history of multiple advanced adenomatous                            polyps).                           - Patient has a contact number available for                            emergencies. The signs and symptoms of potential                            delayed complications were discussed with the                             patient. Return to normal activities tomorrow.                            Written discharge instructions were provided to the                            patient.                           - Resume previous diet.                           - Continue present medications. Docia Chuck. Henrene Pastor, MD 12/11/2021 8:49:21 AM This report has been signed electronically.

## 2021-12-11 NOTE — Progress Notes (Signed)
Pt's states no medical or surgical changes since previsit or office visit. 

## 2021-12-11 NOTE — Progress Notes (Signed)
Report to PACU, RN, vss, BBS= Clear.  

## 2021-12-12 ENCOUNTER — Telehealth: Payer: Self-pay | Admitting: *Deleted

## 2021-12-12 NOTE — Telephone Encounter (Signed)
  Follow up Call-     12/11/2021    7:13 AM  Call back number  Post procedure Call Back phone  # [phone]  Permission to leave phone message Yes     Patient questions:  Do you have a fever, pain , or abdominal swelling? No. Pain Score  0 *  Have you tolerated food without any problems? Yes.    Have you been able to return to your normal activities? Yes.    Do you have any questions about your discharge instructions: Diet   No. Medications  No. Follow up visit  No.  Do you have questions or concerns about your Care? No.  Actions: * If pain score is 4 or above: No action needed, pain <4.

## 2021-12-28 DIAGNOSIS — R87615 Unsatisfactory cytologic smear of cervix: Secondary | ICD-10-CM | POA: Diagnosis not present

## 2021-12-29 DIAGNOSIS — R87615 Unsatisfactory cytologic smear of cervix: Secondary | ICD-10-CM | POA: Diagnosis not present

## 2022-03-23 DIAGNOSIS — F900 Attention-deficit hyperactivity disorder, predominantly inattentive type: Secondary | ICD-10-CM | POA: Diagnosis not present

## 2022-03-23 DIAGNOSIS — Z23 Encounter for immunization: Secondary | ICD-10-CM | POA: Diagnosis not present

## 2022-03-23 DIAGNOSIS — N289 Disorder of kidney and ureter, unspecified: Secondary | ICD-10-CM | POA: Diagnosis not present

## 2022-03-23 DIAGNOSIS — E7800 Pure hypercholesterolemia, unspecified: Secondary | ICD-10-CM | POA: Diagnosis not present

## 2022-03-23 DIAGNOSIS — R7303 Prediabetes: Secondary | ICD-10-CM | POA: Diagnosis not present

## 2022-03-23 DIAGNOSIS — F419 Anxiety disorder, unspecified: Secondary | ICD-10-CM | POA: Diagnosis not present

## 2022-03-23 DIAGNOSIS — M069 Rheumatoid arthritis, unspecified: Secondary | ICD-10-CM | POA: Diagnosis not present

## 2022-04-08 DIAGNOSIS — D649 Anemia, unspecified: Secondary | ICD-10-CM | POA: Diagnosis not present

## 2022-04-08 DIAGNOSIS — N189 Chronic kidney disease, unspecified: Secondary | ICD-10-CM | POA: Diagnosis not present

## 2022-04-08 DIAGNOSIS — N052 Unspecified nephritic syndrome with diffuse membranous glomerulonephritis: Secondary | ICD-10-CM | POA: Diagnosis not present

## 2022-04-08 DIAGNOSIS — E785 Hyperlipidemia, unspecified: Secondary | ICD-10-CM | POA: Diagnosis not present

## 2022-04-08 DIAGNOSIS — I10 Essential (primary) hypertension: Secondary | ICD-10-CM | POA: Diagnosis not present

## 2022-04-14 ENCOUNTER — Other Ambulatory Visit: Payer: Medicare PPO

## 2022-05-18 DIAGNOSIS — Z03818 Encounter for observation for suspected exposure to other biological agents ruled out: Secondary | ICD-10-CM | POA: Diagnosis not present

## 2022-05-18 DIAGNOSIS — J069 Acute upper respiratory infection, unspecified: Secondary | ICD-10-CM | POA: Diagnosis not present

## 2022-07-22 ENCOUNTER — Ambulatory Visit
Admission: RE | Admit: 2022-07-22 | Discharge: 2022-07-22 | Disposition: A | Discharge status: Home / Self Care | Payer: Medicare PPO | Source: Ambulatory Visit | Attending: Family Medicine | Admitting: Family Medicine

## 2022-07-22 DIAGNOSIS — M85852 Other specified disorders of bone density and structure, left thigh: Secondary | ICD-10-CM | POA: Diagnosis not present

## 2022-07-22 DIAGNOSIS — E2839 Other primary ovarian failure: Secondary | ICD-10-CM

## 2022-07-22 DIAGNOSIS — Z780 Asymptomatic menopausal state: Secondary | ICD-10-CM | POA: Diagnosis not present

## 2022-07-28 DIAGNOSIS — D224 Melanocytic nevi of scalp and neck: Secondary | ICD-10-CM | POA: Diagnosis not present

## 2022-07-28 DIAGNOSIS — Z85828 Personal history of other malignant neoplasm of skin: Secondary | ICD-10-CM | POA: Diagnosis not present

## 2022-07-28 DIAGNOSIS — L2084 Intrinsic (allergic) eczema: Secondary | ICD-10-CM | POA: Diagnosis not present

## 2022-07-28 DIAGNOSIS — Z86018 Personal history of other benign neoplasm: Secondary | ICD-10-CM | POA: Diagnosis not present

## 2022-07-28 DIAGNOSIS — L814 Other melanin hyperpigmentation: Secondary | ICD-10-CM | POA: Diagnosis not present

## 2022-07-28 DIAGNOSIS — L821 Other seborrheic keratosis: Secondary | ICD-10-CM | POA: Diagnosis not present

## 2022-07-28 DIAGNOSIS — L578 Other skin changes due to chronic exposure to nonionizing radiation: Secondary | ICD-10-CM | POA: Diagnosis not present

## 2022-07-28 DIAGNOSIS — D225 Melanocytic nevi of trunk: Secondary | ICD-10-CM | POA: Diagnosis not present

## 2022-10-26 DIAGNOSIS — F419 Anxiety disorder, unspecified: Secondary | ICD-10-CM | POA: Diagnosis not present

## 2022-10-26 DIAGNOSIS — E7800 Pure hypercholesterolemia, unspecified: Secondary | ICD-10-CM | POA: Diagnosis not present

## 2022-10-26 DIAGNOSIS — Z0000 Encounter for general adult medical examination without abnormal findings: Secondary | ICD-10-CM | POA: Diagnosis not present

## 2022-10-26 DIAGNOSIS — R7303 Prediabetes: Secondary | ICD-10-CM | POA: Diagnosis not present

## 2022-10-26 DIAGNOSIS — M159 Polyosteoarthritis, unspecified: Secondary | ICD-10-CM | POA: Diagnosis not present

## 2022-10-26 DIAGNOSIS — F900 Attention-deficit hyperactivity disorder, predominantly inattentive type: Secondary | ICD-10-CM | POA: Diagnosis not present

## 2022-10-26 DIAGNOSIS — N289 Disorder of kidney and ureter, unspecified: Secondary | ICD-10-CM | POA: Diagnosis not present

## 2022-10-26 DIAGNOSIS — G479 Sleep disorder, unspecified: Secondary | ICD-10-CM | POA: Diagnosis not present

## 2022-10-26 DIAGNOSIS — M069 Rheumatoid arthritis, unspecified: Secondary | ICD-10-CM | POA: Diagnosis not present

## 2022-11-13 DIAGNOSIS — D649 Anemia, unspecified: Secondary | ICD-10-CM | POA: Diagnosis not present

## 2022-11-13 DIAGNOSIS — I10 Essential (primary) hypertension: Secondary | ICD-10-CM | POA: Diagnosis not present

## 2022-11-13 DIAGNOSIS — K589 Irritable bowel syndrome without diarrhea: Secondary | ICD-10-CM | POA: Diagnosis not present

## 2022-11-13 DIAGNOSIS — N052 Unspecified nephritic syndrome with diffuse membranous glomerulonephritis: Secondary | ICD-10-CM | POA: Diagnosis not present

## 2022-11-13 DIAGNOSIS — E785 Hyperlipidemia, unspecified: Secondary | ICD-10-CM | POA: Diagnosis not present

## 2023-02-23 ENCOUNTER — Other Ambulatory Visit (HOSPITAL_COMMUNITY): Payer: Self-pay

## 2023-02-23 MED ORDER — AMPHETAMINE-DEXTROAMPHET ER 30 MG PO CP24
30.0000 mg | ORAL_CAPSULE | Freq: Every morning | ORAL | 0 refills | Status: AC
  Filled 2023-02-23: qty 30, 30d supply, fill #0

## 2023-02-23 MED ORDER — AMPHETAMINE-DEXTROAMPHET ER 30 MG PO CP24
30.0000 mg | ORAL_CAPSULE | Freq: Every morning | ORAL | 0 refills | Status: AC
  Filled 2023-03-25: qty 30, 30d supply, fill #0

## 2023-02-23 MED ORDER — AMPHETAMINE-DEXTROAMPHET ER 30 MG PO CP24
30.0000 mg | ORAL_CAPSULE | Freq: Every morning | ORAL | 0 refills | Status: DC
  Filled ????-??-??: fill #0

## 2023-03-25 ENCOUNTER — Other Ambulatory Visit (HOSPITAL_COMMUNITY): Payer: Self-pay

## 2023-03-31 DIAGNOSIS — Z79899 Other long term (current) drug therapy: Secondary | ICD-10-CM | POA: Diagnosis not present

## 2023-03-31 DIAGNOSIS — J321 Chronic frontal sinusitis: Secondary | ICD-10-CM | POA: Diagnosis not present

## 2023-03-31 DIAGNOSIS — D84821 Immunodeficiency due to drugs: Secondary | ICD-10-CM | POA: Diagnosis not present

## 2023-03-31 DIAGNOSIS — N289 Disorder of kidney and ureter, unspecified: Secondary | ICD-10-CM | POA: Diagnosis not present

## 2023-04-24 ENCOUNTER — Other Ambulatory Visit (HOSPITAL_COMMUNITY): Payer: Self-pay

## 2023-04-26 ENCOUNTER — Other Ambulatory Visit (HOSPITAL_COMMUNITY): Payer: Self-pay

## 2023-04-26 MED ORDER — AMPHETAMINE-DEXTROAMPHET ER 30 MG PO CP24
30.0000 mg | ORAL_CAPSULE | Freq: Every morning | ORAL | 0 refills | Status: AC
  Filled 2023-04-26: qty 30, 30d supply, fill #0

## 2023-04-27 ENCOUNTER — Other Ambulatory Visit (HOSPITAL_COMMUNITY): Payer: Self-pay

## 2023-05-04 DIAGNOSIS — Z23 Encounter for immunization: Secondary | ICD-10-CM | POA: Diagnosis not present

## 2023-05-04 DIAGNOSIS — E7800 Pure hypercholesterolemia, unspecified: Secondary | ICD-10-CM | POA: Diagnosis not present

## 2023-05-04 DIAGNOSIS — M069 Rheumatoid arthritis, unspecified: Secondary | ICD-10-CM | POA: Diagnosis not present

## 2023-05-04 DIAGNOSIS — M25512 Pain in left shoulder: Secondary | ICD-10-CM | POA: Diagnosis not present

## 2023-05-04 DIAGNOSIS — R7303 Prediabetes: Secondary | ICD-10-CM | POA: Diagnosis not present

## 2023-05-04 DIAGNOSIS — G8929 Other chronic pain: Secondary | ICD-10-CM | POA: Diagnosis not present

## 2023-05-04 DIAGNOSIS — R809 Proteinuria, unspecified: Secondary | ICD-10-CM | POA: Diagnosis not present

## 2023-05-04 DIAGNOSIS — F900 Attention-deficit hyperactivity disorder, predominantly inattentive type: Secondary | ICD-10-CM | POA: Diagnosis not present

## 2023-05-04 DIAGNOSIS — F419 Anxiety disorder, unspecified: Secondary | ICD-10-CM | POA: Diagnosis not present

## 2023-05-18 DIAGNOSIS — R21 Rash and other nonspecific skin eruption: Secondary | ICD-10-CM | POA: Diagnosis not present

## 2023-05-18 DIAGNOSIS — I10 Essential (primary) hypertension: Secondary | ICD-10-CM | POA: Diagnosis not present

## 2023-05-18 DIAGNOSIS — E785 Hyperlipidemia, unspecified: Secondary | ICD-10-CM | POA: Diagnosis not present

## 2023-05-18 DIAGNOSIS — Z0000 Encounter for general adult medical examination without abnormal findings: Secondary | ICD-10-CM | POA: Diagnosis not present

## 2023-05-18 DIAGNOSIS — D649 Anemia, unspecified: Secondary | ICD-10-CM | POA: Diagnosis not present

## 2023-05-18 DIAGNOSIS — N052 Unspecified nephritic syndrome with diffuse membranous glomerulonephritis: Secondary | ICD-10-CM | POA: Diagnosis not present

## 2023-05-24 ENCOUNTER — Other Ambulatory Visit: Payer: Self-pay | Admitting: Nephrology

## 2023-05-24 DIAGNOSIS — Z1231 Encounter for screening mammogram for malignant neoplasm of breast: Secondary | ICD-10-CM

## 2023-06-01 DIAGNOSIS — M25512 Pain in left shoulder: Secondary | ICD-10-CM | POA: Diagnosis not present

## 2023-06-02 ENCOUNTER — Ambulatory Visit
Admission: RE | Admit: 2023-06-02 | Discharge: 2023-06-02 | Disposition: A | Discharge status: Home / Self Care | Payer: Medicare PPO | Source: Ambulatory Visit | Attending: Nephrology | Admitting: Nephrology

## 2023-06-02 DIAGNOSIS — Z1231 Encounter for screening mammogram for malignant neoplasm of breast: Secondary | ICD-10-CM

## 2023-06-22 ENCOUNTER — Ambulatory Visit: Payer: Medicare PPO | Attending: Sports Medicine

## 2023-06-22 ENCOUNTER — Other Ambulatory Visit: Payer: Self-pay

## 2023-06-22 DIAGNOSIS — G8929 Other chronic pain: Secondary | ICD-10-CM

## 2023-06-22 DIAGNOSIS — M25512 Pain in left shoulder: Secondary | ICD-10-CM | POA: Diagnosis not present

## 2023-06-22 NOTE — Therapy (Signed)
 OUTPATIENT PHYSICAL THERAPY SHOULDER EVALUATION   Patient Name: Jessica Galvan MRN: [id number] DOB:17-May-1955, 68 y.o., female Today's Date: 06/22/2023  END OF SESSION:  PT End of Session - 06/22/23 0959     Visit Number 1    Number of Visits 9    Date for PT Re-Evaluation 08/17/23    Authorization Type [employer] MCR    PT Start Time 1000    PT Stop Time 1039    PT Time Calculation (min) 39 min    Behavior During Therapy WFL for tasks assessed/performed             Past Medical History:  Diagnosis Date   ADHD (attention deficit hyperactivity disorder), combined type    Anxiety    Arthritis    Cancer (HCC) 2013   LEFT LEG-skin cancer melanoma   Chronic kidney disease    membranous neuropathy   Environmental allergies    GERD (gastroesophageal reflux disease)    Hypercholesterolemia    Hypertension    Past Surgical History:  Procedure Laterality Date   BREAST BIOPSY Right    CESAREAN SECTION     2   COLONOSCOPY     POLYPECTOMY     RENAL BIOPSY, OPEN     Patient Active Problem List   Diagnosis Date Noted   Atrophic vaginitis 11/11/2016   Proteinuria 11/11/2016   Hyperlipidemia 07/03/2013    PCP: Elias Else, MD   REFERRING PROVIDER: Hurman Horn, MD   REFERRING DIAG: Pain in left shoulder [M25.512]   THERAPY DIAG:  Chronic left shoulder pain  Rationale for Evaluation and Treatment: Rehabilitation  ONSET DATE: 06/07/2023 date of referral  SUBJECTIVE:                                                                                                                                                                                      SUBJECTIVE STATEMENT: Patient presents to PT d/t L shoulder pain that has been present for over a 1 year without known injury. 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 She is currently caring for her 11 year old grandson and her 54 y.o. mother. She also  reports some pain with positioning at night. Although, this has become less painful. She wants to be more physically active for her general health and diabetes management; however, she feels limited with some exercises d/t L shoulder "popping" and pain.   Hand dominance: Left  PERTINENT HISTORY: Relevant PMHx includes ADHD, anxiety, arthritis, CKD, GERD, HTN  PAIN:  Are you having pain?  Yes: NPRS scale: 4/10 Pain location: L shoulder (some paresthesia down  to L hand/fingers) Pain description: soreness Aggravating factors: opening jars, overhead reaching Relieving factors: Holding/supporting my shoulder at night, tylenol pm (as needed)   PRECAUTIONS: None  RED FLAGS: None   WEIGHT BEARING RESTRICTIONS: No  FALLS:  Has patient fallen in last 6 months? 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 Have regular exercise routine.   NEXT MD VISIT:   OBJECTIVE:  Note: Objective measures were completed at Evaluation unless otherwise noted.  DIAGNOSTIC FINDINGS:  06/01/23 - per referring provider's: "X-ray findings show mild osteoarthritis in glenohumeral and acromioclavicular joints, consistent with OA and possible supraspinatus tendon impingement."   PATIENT SURVEYS:  Quick Dash 34.1%  Unable to open jar Moderate difficulty with heavy household chores, heavy lifting/carrying and recreational activities requiring impact through UE   Mild pins & needles sensation   COGNITION: Overall cognitive status: Within functional limits for tasks assessed     SENSATION: Not tested  POSTURE: WFL while seated  UPPER EXTREMITY ROM:   Active ROM Right eval Left eval  Shoulder flexion [facility]  Shoulder extension    Shoulder abduction [facility]   Shoulder adduction    Shoulder internal rotation    Shoulder external rotation    Elbow flexion    Elbow extension    Wrist flexion    Wrist extension    Wrist ulnar deviation    Wrist radial deviation    Wrist pronation    Wrist supination    (Blank rows = not tested)  UPPER EXTREMITY MMT:  MMT Right eval Left eval  Shoulder flexion 4+ 4-  Shoulder extension    Shoulder abduction 4+ 4-  Shoulder adduction    Shoulder internal rotation 4+ 3+  Shoulder external rotation 4+ 3+  Middle trapezius    Lower trapezius    Elbow flexion    Elbow extension    Wrist flexion    Wrist extension    Wrist ulnar deviation    Wrist radial deviation    Wrist pronation    Wrist supination    Grip strength (lbs) 30# 20#  (Blank rows = not tested)  SHOULDER SPECIAL TESTS: Impingement tests: Hawkins/Kennedy impingement test: positive  Rotator cuff assessment: External rotation lag sign: negative  PALPATION:  Moderate-to-severe tenderness to palpation along                                                                                                                              TREATMENT DATE:   [facility] Adult PT Treatment:  DATE: 06/22/2023   Initial evaluation: see patient education and home exercise program as noted below       PATIENT EDUCATION: Education details: reviewed initial home exercise program; discussion of POC, prognosis and goals for skilled PT; discussed chronic use of UE for caregiving tasks and role in chronic L shoulder pain and weakness d/t "overuse" and likelihood of poor body mechanics    Person educated: Patient Education method: Explanation, Demonstration, and Handouts Education comprehension: verbalized understanding, returned demonstration, and needs further education  HOME EXERCISE PROGRAM: Access Code: [email or web address] URL: [email or web address] Date: 06/22/2023 Prepared by: Mauri Reading  Exercises - Median Nerve Flossing - Tray  - 1 x daily - 7 x weekly - 1 sets - 10 reps - 3 sec hold - Isometric Shoulder Extension at Wall  - 1 x daily - 7 x weekly - 1 sets - 10 reps - 3 sec hold - Isometric Shoulder Abduction at Wall  - 1 x daily - 7 x weekly - 1 sets - 10 reps - 3 sec hold  ASSESSMENT:  CLINICAL IMPRESSION: Jessica Galvan is a 68 y.o. female who was seen today for physical therapy evaluation and treatment for persistent Left shoulder Pain with strength deficits. She has diminished L UE MMT scores, decreased L>R grip strength, and positive impingement tests. To has significant tenderness to palpation and symptomatic myofascial trigger points along supraspinatus mm belly and insertion point and throughout middle deltoid mm belly. She has related pain and difficulty with daily activities, including caregiving tasks including lifting, carrying, assisting with transfers, and other household responsibilities. She requires skilled PT services at this time to address relevant deficits and improve overall function.     OBJECTIVE IMPAIRMENTS: decreased activity tolerance, decreased strength, impaired UE functional use, and pain.   ACTIVITY LIMITATIONS: carrying, lifting, sleeping, bed mobility, and caring for others  PARTICIPATION LIMITATIONS: meal prep, cleaning, laundry, interpersonal relationship, driving, shopping, and community activity  PERSONAL FACTORS: Past/current experiences, Time since onset of injury/illness/exacerbation, and 3+ comorbidities: Relevant PMHx includes ADHD, anxiety, arthritis, CKD, GERD, HTN  are also affecting patient's functional outcome.   REHAB POTENTIAL: Fair    CLINICAL DECISION MAKING: Evolving/moderate complexity  EVALUATION COMPLEXITY: Moderate   GOALS: Goals reviewed with patient? Yes  SHORT TERM GOALS: Target date: 07/20/2023   Patient will be independent with initial home program for UE mobility and strengthening  Baseline: provided at  eval  Goal status: INITIAL  2.  Patient will demonstrate improved L grip strength to at least 25lbs. Baseline: see objective measures  Goal status: INITIAL  LONG TERM GOALS: Target date: 08/17/2023    Patient will report improved overall functional ability with quick DASH score of 20 or less.  Baseline: 34.1 Goal status: INITIAL  2.  Patient will demonstrate improved L shoulder MMT scores to at least 4+/5 throughout Baseline: see objective measures  Goal status: INITIAL  3.  Patient will demonstrate improved L grip strength to at least 30lbs Baseline: see objective measures Goal status: INITIAL  4.  Patient will demonstrate ability to perform overhead lifting of at least 10# using appropriate body mechanics and with no more than minimal pain in order to safely perform normal daily/occupational tasks.   Baseline: unable  Goal status: INITIAL  5.  Patient will demonstrate ability to perform floor to waist lifting of at least 25# using appropriate body mechanics and with no more than minimal pain in order to safely perform normal daily/occupational tasks.   Baseline: unable  Goal status: INITIAL  6.  Patient will report ability to sleep through the night without waking from pain at least 4 nights/week.  Baseline: pain with positioning  Goal status: INITIAL  PLAN:  PT FREQUENCY: 1-2x/week  PT DURATION: 8 weeks  PLANNED INTERVENTIONS: 97164- PT Re-evaluation, 97110-Therapeutic exercises, 97530- Therapeutic activity, [id number]- Neuromuscular re-education, 97535- Self Care, [id number]- Manual therapy, [id number]- Electrical stimulation (unattended), Dry Needling, Cryotherapy, and Moist heat  PLAN FOR NEXT SESSION: L shoulder isometric strengthening, periscapular strengthening, pain modulation via AAROM/aerobic activities, modalities, manual therapy, TPDN as indicated    Mauri Reading, PT, DPT  06/22/2023 4:26 PM   Referring diagnosis? Pain in left shoulder [M25.512]  Treatment diagnosis?  (if different than referring diagnosis) Chronic left shoulder pain [M25.512] What was this (referring dx) caused by? []  Surgery []  Fall [x]  Ongoing issue []  Arthritis []  Other: ____________  Laterality: []  Rt [x]  Lt []  Both  Check all possible CPT codes:  *CHOOSE 10 OR LESS*    See Planned Interventions listed in the Plan section of the Evaluation.

## 2023-06-30 ENCOUNTER — Ambulatory Visit

## 2023-07-07 ENCOUNTER — Ambulatory Visit

## 2023-07-07 DIAGNOSIS — G8929 Other chronic pain: Secondary | ICD-10-CM | POA: Diagnosis not present

## 2023-07-07 DIAGNOSIS — M25512 Pain in left shoulder: Secondary | ICD-10-CM | POA: Diagnosis not present

## 2023-07-07 NOTE — Therapy (Signed)
 OUTPATIENT PHYSICAL THERAPY TREATMENT NOTE   Patient Name: Jessica Galvan MRN: [id number] DOB:09-10-1955, 68 y.o., female Today's Date: 07/07/2023  END OF SESSION:  PT End of Session - 07/07/23 1617     Visit Number 2    Number of Visits 9    Date for PT Re-Evaluation 08/17/23    Authorization Type [employer] MCR    PT Start Time 1615    PT Stop Time 1653    PT Time Calculation (min) 38 min    Activity Tolerance Patient tolerated treatment well    Behavior During Therapy WFL for tasks assessed/performed              Past Medical History:  Diagnosis Date   ADHD (attention deficit hyperactivity disorder), combined type    Anxiety    Arthritis    Cancer (HCC) 2013   LEFT LEG-skin cancer melanoma   Chronic kidney disease    membranous neuropathy   Environmental allergies    GERD (gastroesophageal reflux disease)    Hypercholesterolemia    Hypertension    Past Surgical History:  Procedure Laterality Date   BREAST BIOPSY Right    CESAREAN SECTION     2   COLONOSCOPY     POLYPECTOMY     RENAL BIOPSY, OPEN     Patient Active Problem List   Diagnosis Date Noted   Atrophic vaginitis 11/11/2016   Proteinuria 11/11/2016   Hyperlipidemia 07/03/2013    PCP: Elias Else, MD   REFERRING PROVIDER: Hurman Horn, MD   REFERRING DIAG: Pain in left shoulder [M25.512]   THERAPY DIAG:  Chronic left shoulder pain  Rationale for Evaluation and Treatment: Rehabilitation  ONSET DATE: 06/07/2023 date of referral  SUBJECTIVE:                                                                                                                                                                                      SUBJECTIVE STATEMENT: Patient reports no current pain and that she noticed some tingling in her LUE with her home exercises, particularly the median nerve glides.  EVAL: Patient presents to PT d/t L shoulder pain that has been present for over a 1 year without known  injury. 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 She is currently caring for her 45 year old grandson and her 22 y.o. mother. She also reports some pain with positioning at night. Although, this has become less painful. She wants to be more physically active for her general health and diabetes management; however, she feels limited with some exercises d/t L shoulder "popping" and pain.  Hand dominance: Left  PERTINENT HISTORY: Relevant PMHx includes ADHD, anxiety, arthritis, CKD, GERD, HTN  PAIN:  Are you having pain?  Yes: NPRS scale: 4/10 Pain location: L shoulder (some paresthesia down to L hand/fingers) Pain description: soreness Aggravating factors: opening jars, overhead reaching Relieving factors: Holding/supporting my shoulder at night, tylenol pm (as needed)   PRECAUTIONS: None  RED FLAGS: None   WEIGHT BEARING RESTRICTIONS: No  FALLS:  Has patient fallen in last 6 months? 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 Have regular exercise routine.   NEXT MD VISIT:   OBJECTIVE:  Note: Objective measures were completed at Evaluation unless otherwise noted.  DIAGNOSTIC FINDINGS:  06/01/23 - per referring provider's: "X-ray findings show mild osteoarthritis in glenohumeral and acromioclavicular joints, consistent with OA and possible supraspinatus tendon impingement."   PATIENT SURVEYS:  Quick Dash 34.1%  Unable to open jar Moderate difficulty with heavy household chores, heavy lifting/carrying and recreational activities requiring impact through UE   Mild pins & needles sensation   COGNITION: Overall cognitive status: Within functional limits for tasks  assessed     SENSATION: Not tested  POSTURE: WFL while seated  UPPER EXTREMITY ROM:   Active ROM Right eval Left eval  Shoulder flexion [facility]  Shoulder extension    Shoulder abduction [facility]  Shoulder adduction    Shoulder internal rotation    Shoulder external rotation    Elbow flexion    Elbow extension    Wrist flexion    Wrist extension    Wrist ulnar deviation    Wrist radial deviation    Wrist pronation    Wrist supination    (Blank rows = not tested)  UPPER EXTREMITY MMT:  MMT Right eval Left eval  Shoulder flexion 4+ 4-  Shoulder extension    Shoulder abduction 4+ 4-  Shoulder adduction    Shoulder internal rotation 4+ 3+  Shoulder external rotation 4+ 3+  Middle trapezius    Lower trapezius    Elbow flexion    Elbow extension    Wrist flexion    Wrist extension    Wrist ulnar deviation    Wrist radial deviation    Wrist pronation    Wrist supination    Grip strength (lbs) 30# 20#  (Blank rows = not tested)  SHOULDER SPECIAL TESTS: Impingement tests: Hawkins/Kennedy impingement test: positive  Rotator cuff assessment: External rotation lag sign: negative  PALPATION:  Moderate-to-severe tenderness to palpation along                                                                                                                              TREATMENT DATE:  [facility] Adult PT Treatment:  DATE: 07/07/23 Therapeutic Exercise: Nustep level 3 x 8 mins while gathering subjective and planning session with patient Seated pulleys flexion/scaption x2' ea Rows RTB 2x10 Shoulder extension RTB 2x10 Supine shoulder flexion AAROM with dowel focus on inhale/exhale 2x10 Supine chest press with dowel 2x10 Supine shoulder horizontal abduction YTB 2x10 Supine shoulder flexion AROM Lt 500g ball 2x10   [facility] Adult PT Treatment:                                                DATE: 06/22/2023   Initial evaluation: see  patient education and home exercise program as noted below     PATIENT EDUCATION: Education details: reviewed initial home exercise program; discussion of POC, prognosis and goals for skilled PT; discussed chronic use of UE for caregiving tasks and role in chronic L shoulder pain and weakness d/t "overuse" and likelihood of poor body mechanics    Person educated: Patient Education method: Explanation, Demonstration, and Handouts Education comprehension: verbalized understanding, returned demonstration, and needs further education  HOME EXERCISE PROGRAM: Access Code: [email or web address] URL: [email or web address] Date: 06/22/2023 Prepared by: Mauri Reading  Exercises - Median Nerve Flossing - Tray  - 1 x daily - 7 x weekly - 1 sets - 10 reps - 3 sec hold - Isometric Shoulder Extension at Wall  - 1 x daily - 7 x weekly - 1 sets - 10 reps - 3 sec hold - Isometric Shoulder Abduction at Wall  - 1 x daily - 7 x weekly - 1 sets - 10 reps - 3 sec hold  ASSESSMENT:  CLINICAL IMPRESSION: Patient presents to first follow up PT session reporting no current pain in her shoulder and that she has become good at avoiding positions that aggravate the shoulder. Session today focused on shoulder mobility as well as gentle periscapular strengthening. She notes increased pain with any crossbody motions. Patient was able to tolerate all prescribed exercises with no adverse effects. Patient continues to benefit from skilled PT services and should be progressed as able to improve functional independence.   EVAL: Nadirah is a 68 y.o. female who was seen today for physical therapy evaluation and treatment for persistent Left shoulder Pain with strength deficits. She has diminished L UE MMT scores, decreased L>R grip strength, and positive impingement tests. To has significant tenderness to palpation and symptomatic myofascial trigger points along supraspinatus mm belly and insertion point and throughout middle  deltoid mm belly. She has related pain and difficulty with daily activities, including caregiving tasks including lifting, carrying, assisting with transfers, and other household responsibilities. She requires skilled PT services at this time to address relevant deficits and improve overall function.     OBJECTIVE IMPAIRMENTS: decreased activity tolerance, decreased strength, impaired UE functional use, and pain.   ACTIVITY LIMITATIONS: carrying, lifting, sleeping, bed mobility, and caring for others  PARTICIPATION LIMITATIONS: meal prep, cleaning, laundry, interpersonal relationship, driving, shopping, and community activity  PERSONAL FACTORS: Past/current experiences, Time since onset of injury/illness/exacerbation, and 3+ comorbidities: Relevant PMHx includes ADHD, anxiety, arthritis, CKD, GERD, HTN  are also affecting patient's functional outcome.   REHAB POTENTIAL: Fair    CLINICAL DECISION MAKING: Evolving/moderate complexity  EVALUATION COMPLEXITY: Moderate   GOALS: Goals reviewed with patient? Yes  SHORT TERM GOALS: Target date: 07/20/2023   Patient will be independent with initial home program for UE mobility and strengthening  Baseline: provided at eval  Goal status: INITIAL  2.  Patient will demonstrate improved L grip strength to at least 25lbs. Baseline: see objective measures  Goal status: INITIAL  LONG TERM GOALS: Target date: 08/17/2023    Patient will report improved overall functional ability with quick DASH score of 20 or less.  Baseline: 34.1 Goal status: INITIAL  2.  Patient will demonstrate improved L shoulder MMT scores to at least 4+/5 throughout Baseline: see objective measures  Goal status: INITIAL  3.  Patient will demonstrate improved L grip strength to at least 30lbs Baseline: see objective measures Goal status: INITIAL  4.  Patient will demonstrate ability to perform overhead lifting of at least 10# using appropriate body mechanics and with  no more than minimal pain in order to safely perform normal daily/occupational tasks.   Baseline: unable  Goal status: INITIAL  5.  Patient will demonstrate ability to perform floor to waist lifting of at least 25# using appropriate body mechanics and with no more than minimal pain in order to safely perform normal daily/occupational tasks.   Baseline: unable  Goal status: INITIAL  6.  Patient will report ability to sleep through the night without waking from pain at least 4 nights/week.  Baseline: pain with positioning  Goal status: INITIAL  PLAN:  PT FREQUENCY: 1-2x/week  PT DURATION: 8 weeks  PLANNED INTERVENTIONS: 97164- PT Re-evaluation, 97110-Therapeutic exercises, 97530- Therapeutic activity, [id number]- Neuromuscular re-education, 97535- Self Care, [id number]- Manual therapy, [id number]- Electrical stimulation (unattended), Dry Needling, Cryotherapy, and Moist heat  PLAN FOR NEXT SESSION: L shoulder isometric strengthening, periscapular strengthening, pain modulation via AAROM/aerobic activities, modalities, manual therapy, TPDN as indicated    Berta Minor PTA  07/07/2023 4:52 PM

## 2023-07-09 ENCOUNTER — Ambulatory Visit: Admitting: Physical Therapy

## 2023-07-09 ENCOUNTER — Encounter: Payer: Self-pay | Admitting: Physical Therapy

## 2023-07-09 DIAGNOSIS — G8929 Other chronic pain: Secondary | ICD-10-CM

## 2023-07-09 DIAGNOSIS — M25512 Pain in left shoulder: Secondary | ICD-10-CM | POA: Diagnosis not present

## 2023-07-09 NOTE — Therapy (Signed)
 OUTPATIENT PHYSICAL THERAPY TREATMENT NOTE   Patient Name: Jessica Galvan MRN: [id number] DOB:Mar 30, 1956, 68 y.o., female Today's Date: 07/09/2023  END OF SESSION:  PT End of Session - 07/09/23 0957     Visit Number 3    Number of Visits 9    Date for PT Re-Evaluation 08/17/23    Authorization Type [employer] MCR    PT Start Time 1000    PT Stop Time 1041    PT Time Calculation (min) 41 min    Activity Tolerance Patient tolerated treatment well    Behavior During Therapy WFL for tasks assessed/performed              Past Medical History:  Diagnosis Date   ADHD (attention deficit hyperactivity disorder), combined type    Anxiety    Arthritis    Cancer (HCC) 2013   LEFT LEG-skin cancer melanoma   Chronic kidney disease    membranous neuropathy   Environmental allergies    GERD (gastroesophageal reflux disease)    Hypercholesterolemia    Hypertension    Past Surgical History:  Procedure Laterality Date   BREAST BIOPSY Right    CESAREAN SECTION     2   COLONOSCOPY     POLYPECTOMY     RENAL BIOPSY, OPEN     Patient Active Problem List   Diagnosis Date Noted   Atrophic vaginitis 11/11/2016   Proteinuria 11/11/2016   Hyperlipidemia 07/03/2013    PCP: Elias Else, MD   REFERRING PROVIDER: Hurman Horn, MD   REFERRING DIAG: Pain in left shoulder [M25.512]   THERAPY DIAG:  Chronic left shoulder pain  Rationale for Evaluation and Treatment: Rehabilitation  ONSET DATE: 06/07/2023 date of referral  SUBJECTIVE:                                                                                                                                                                                      SUBJECTIVE STATEMENT: Pt reports that she was doing a deep clean of her mom's house over the last few days which has irritated her back.  EVAL: Patient presents to PT d/t L shoulder pain that has been present for over a 1 year without known injury. After further  discussion, she does report history of caregiving for multiple family members for 5 or more years. She is currently caring for her 21 year old grandson and her 74 y.o. mother. She also reports some pain with positioning at night. Although, this has become less painful. She wants to be more physically active for her general health and diabetes management; however, she feels limited with some exercises d/t L shoulder "popping" and pain.  Hand dominance: Left  PERTINENT HISTORY: Relevant PMHx includes ADHD, anxiety, arthritis, CKD, GERD, HTN  PAIN:  Are you having pain?  Yes: NPRS scale: 4/10 Pain location: L shoulder (some paresthesia down to L hand/fingers) Pain description: soreness Aggravating factors: opening jars, overhead reaching Relieving factors: Holding/supporting my shoulder at night, tylenol pm (as needed)   PRECAUTIONS: None  RED FLAGS: None   WEIGHT BEARING RESTRICTIONS: No  FALLS:  Has patient fallen in last 6 months? 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 Have regular exercise routine.   NEXT MD VISIT:   OBJECTIVE:  Note: Objective measures were completed at Evaluation unless otherwise noted.  DIAGNOSTIC FINDINGS:  06/01/23 - per referring provider's: "X-ray findings show mild osteoarthritis in glenohumeral and acromioclavicular joints, consistent with OA and possible supraspinatus tendon impingement."   PATIENT SURVEYS:  Quick Dash 34.1%  Unable to open jar Moderate difficulty with heavy household chores, heavy lifting/carrying and recreational activities requiring impact through UE   Mild pins & needles sensation   COGNITION: Overall cognitive status: Within functional limits for tasks assessed     SENSATION: Not  tested  POSTURE: WFL while seated  UPPER EXTREMITY ROM:   Active ROM Right eval Left eval  Shoulder flexion [facility]  Shoulder extension    Shoulder abduction [facility]  Shoulder adduction    Shoulder internal rotation    Shoulder external rotation    Elbow flexion    Elbow extension    Wrist flexion    Wrist extension    Wrist ulnar deviation    Wrist radial deviation    Wrist pronation    Wrist supination    (Blank rows = not tested)  UPPER EXTREMITY MMT:  MMT Right eval Left eval  Shoulder flexion 4+ 4-  Shoulder extension    Shoulder abduction 4+ 4-  Shoulder adduction    Shoulder internal rotation 4+ 3+  Shoulder external rotation 4+ 3+  Middle trapezius    Lower trapezius    Elbow flexion    Elbow extension    Wrist flexion    Wrist extension    Wrist ulnar deviation    Wrist radial deviation    Wrist pronation    Wrist supination    Grip strength (lbs) 30# 20#  (Blank rows = not tested)  SHOULDER SPECIAL TESTS: Impingement tests: Hawkins/Kennedy impingement test: positive  Rotator cuff assessment: External rotation lag sign: negative  PALPATION:  Moderate-to-severe tenderness to palpation along                                                                                                                              TREATMENT DATE:  [facility] Adult PT Treatment:  DATE: 07/09/2023 Therapeutic Exercise: UBE 2.5'/2.5' fwd and backward for warm up while taking subjective Rows GTB 2x10 Shoulder extension GTB 2x10 L shoulder adduction - GTB - 2x10 S/L ER  - 2# - 2x10 S/L flexion - 1# - 2x10 S/L horizontal abd - 1# - 2x10 Supine shoulder flexion with swiss ball Supine chest press uni - 2# - 2x10 Supine SA punch - 2# - 2x10 Supine shoulder horizontal abduction RTB 2x10 Pt education: R/C and role in shoulder movements  Manual Therapy  Inferior and AP glides L GH joint Juliene Pina   [facility] Adult PT Treatment:                                                 DATE: 06/22/2023   Initial evaluation: see patient education and home exercise program as noted below     PATIENT EDUCATION: Education details: reviewed initial home exercise program; discussion of POC, prognosis and goals for skilled PT; discussed chronic use of UE for caregiving tasks and role in chronic L shoulder pain and weakness d/t "overuse" and likelihood of poor body mechanics    Person educated: Patient Education method: Explanation, Demonstration, and Handouts Education comprehension: verbalized understanding, returned demonstration, and needs further education  HOME EXERCISE PROGRAM: Access Code: [email or web address] URL: [email or web address] Date: 06/22/2023 Prepared by: Mauri Reading  Exercises - Median Nerve Flossing - Tray  - 1 x daily - 7 x weekly - 1 sets - 10 reps - 3 sec hold - Isometric Shoulder Extension at Wall  - 1 x daily - 7 x weekly - 1 sets - 10 reps - 3 sec hold - Isometric Shoulder Abduction at Wall  - 1 x daily - 7 x weekly - 1 sets - 10 reps - 3 sec hold  ASSESSMENT:  CLINICAL IMPRESSION: Pt presents with minimal pain.  Progressed intensity of R/C exercises and introduced concentric movements.  She reports only minimal increase in pain with S/L exercises which quickly dissipates with test.  Will continue to progress as able.  EVAL: Tayana is a 68 y.o. female who was seen today for physical therapy evaluation and treatment for persistent Left shoulder Pain with strength deficits. She has diminished L UE MMT scores, decreased L>R grip strength, and positive impingement tests. To has significant tenderness to palpation and symptomatic myofascial trigger points along supraspinatus mm belly and insertion point and throughout middle deltoid mm belly. She has related pain and difficulty with daily activities, including caregiving tasks including lifting, carrying, assisting with transfers, and other household  responsibilities. She requires skilled PT services at this time to address relevant deficits and improve overall function.     OBJECTIVE IMPAIRMENTS: decreased activity tolerance, decreased strength, impaired UE functional use, and pain.   ACTIVITY LIMITATIONS: carrying, lifting, sleeping, bed mobility, and caring for others  PARTICIPATION LIMITATIONS: meal prep, cleaning, laundry, interpersonal relationship, driving, shopping, and community activity  PERSONAL FACTORS: Past/current experiences, Time since onset of injury/illness/exacerbation, and 3+ comorbidities: Relevant PMHx includes ADHD, anxiety, arthritis, CKD, GERD, HTN  are also affecting patient's functional outcome.   REHAB POTENTIAL: Fair    CLINICAL DECISION MAKING: Evolving/moderate complexity  EVALUATION COMPLEXITY: Moderate   GOALS: Goals reviewed with patient? Yes  SHORT TERM GOALS: Target date: 07/20/2023   Patient will be independent with initial home program for UE mobility and strengthening  Baseline: provided at  eval  Goal status: INITIAL  2.  Patient will demonstrate improved L grip strength to at least 25lbs. Baseline: see objective measures  Goal status: INITIAL  LONG TERM GOALS: Target date: 08/17/2023    Patient will report improved overall functional ability with quick DASH score of 20 or less.  Baseline: 34.1 Goal status: INITIAL  2.  Patient will demonstrate improved L shoulder MMT scores to at least 4+/5 throughout Baseline: see objective measures  Goal status: INITIAL  3.  Patient will demonstrate improved L grip strength to at least 30lbs Baseline: see objective measures Goal status: INITIAL  4.  Patient will demonstrate ability to perform overhead lifting of at least 10# using appropriate body mechanics and with no more than minimal pain in order to safely perform normal daily/occupational tasks.   Baseline: unable  Goal status: INITIAL  5.  Patient will demonstrate ability to perform  floor to waist lifting of at least 25# using appropriate body mechanics and with no more than minimal pain in order to safely perform normal daily/occupational tasks.   Baseline: unable  Goal status: INITIAL  6.  Patient will report ability to sleep through the night without waking from pain at least 4 nights/week.  Baseline: pain with positioning  Goal status: INITIAL  PLAN:  PT FREQUENCY: 1-2x/week  PT DURATION: 8 weeks  PLANNED INTERVENTIONS: 97164- PT Re-evaluation, 97110-Therapeutic exercises, 97530- Therapeutic activity, [id number]- Neuromuscular re-education, 97535- Self Care, [id number]- Manual therapy, [id number]- Electrical stimulation (unattended), Dry Needling, Cryotherapy, and Moist heat  PLAN FOR NEXT SESSION: L shoulder isometric strengthening, periscapular strengthening, pain modulation via AAROM/aerobic activities, modalities, manual therapy, TPDN as indicated    Kimberlee Nearing Shanicqua Coldren PT  07/09/2023 10:43 AM

## 2023-07-13 ENCOUNTER — Ambulatory Visit

## 2023-07-13 DIAGNOSIS — M25512 Pain in left shoulder: Secondary | ICD-10-CM | POA: Diagnosis not present

## 2023-07-13 DIAGNOSIS — G8929 Other chronic pain: Secondary | ICD-10-CM

## 2023-07-13 NOTE — Therapy (Signed)
 OUTPATIENT PHYSICAL THERAPY TREATMENT NOTE   Patient Name: Jessica Galvan MRN: [id number] DOB:15-Oct-1955, 68 y.o., female Today's Date: 07/13/2023  END OF SESSION:  PT End of Session - 07/13/23 0957     Visit Number 4    Number of Visits 9    Date for PT Re-Evaluation 08/17/23    Authorization Type [employer] MCR    PT Start Time 1000    PT Stop Time 1040    PT Time Calculation (min) 40 min    Activity Tolerance Patient tolerated treatment well    Behavior During Therapy WFL for tasks assessed/performed            Past Medical History:  Diagnosis Date   ADHD (attention deficit hyperactivity disorder), combined type    Anxiety    Arthritis    Cancer (HCC) 2013   LEFT LEG-skin cancer melanoma   Chronic kidney disease    membranous neuropathy   Environmental allergies    GERD (gastroesophageal reflux disease)    Hypercholesterolemia    Hypertension    Past Surgical History:  Procedure Laterality Date   BREAST BIOPSY Right    CESAREAN SECTION     2   COLONOSCOPY     POLYPECTOMY     RENAL BIOPSY, OPEN     Patient Active Problem List   Diagnosis Date Noted   Atrophic vaginitis 11/11/2016   Proteinuria 11/11/2016   Hyperlipidemia 07/03/2013    PCP: Elias Else, MD   REFERRING PROVIDER: Hurman Horn, MD   REFERRING DIAG: Pain in left shoulder [M25.512]   THERAPY DIAG:  Chronic left shoulder pain  Rationale for Evaluation and Treatment: Rehabilitation  ONSET DATE: 06/07/2023 date of referral  SUBJECTIVE:                                                                                                                                                                                      SUBJECTIVE STATEMENT: Patient reports some soreness in her shoulder today, states that she "slept on it funny."   EVAL: Patient presents to PT d/t L shoulder pain that has been present for over a 1 year without known injury. After further discussion, she does report history  of caregiving for multiple family members for 5 or more years. She is currently caring for her 11 year old grandson and her 40 y.o. mother. She also reports some pain with positioning at night. Although, this has become less painful. She wants to be more physically active for her general health and diabetes management; however, she feels limited with some exercises d/t L shoulder "popping" and pain.   Hand dominance: Left  PERTINENT HISTORY: Relevant PMHx  includes ADHD, anxiety, arthritis, CKD, GERD, HTN  PAIN:  Are you having pain?  Yes: NPRS scale: 4/10 Pain location: L shoulder (some paresthesia down to L hand/fingers) Pain description: soreness Aggravating factors: opening jars, overhead reaching Relieving factors: Holding/supporting my shoulder at night, tylenol pm (as needed)   PRECAUTIONS: None  RED FLAGS: None   WEIGHT BEARING RESTRICTIONS: No  FALLS:  Has patient fallen in last 6 months? 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 Have regular exercise routine.   NEXT MD VISIT:   OBJECTIVE:  Note: Objective measures were completed at Evaluation unless otherwise noted.  DIAGNOSTIC FINDINGS:  06/01/23 - per referring provider's: "X-ray findings show mild osteoarthritis in glenohumeral and acromioclavicular joints, consistent with OA and possible supraspinatus tendon impingement."   PATIENT SURVEYS:  Quick Dash 34.1%  Unable to open jar Moderate difficulty with heavy household chores, heavy lifting/carrying and recreational activities requiring impact through UE   Mild pins & needles sensation   COGNITION: Overall cognitive status: Within functional limits for tasks assessed     SENSATION: Not tested  POSTURE: WFL while  seated  UPPER EXTREMITY ROM:   Active ROM Right eval Left eval  Shoulder flexion [facility]  Shoulder extension    Shoulder abduction [facility]  Shoulder adduction    Shoulder internal rotation    Shoulder external rotation    Elbow flexion    Elbow extension    Wrist flexion    Wrist extension    Wrist ulnar deviation    Wrist radial deviation    Wrist pronation    Wrist supination    (Blank rows = not tested)  UPPER EXTREMITY MMT:  MMT Right eval Left eval  Shoulder flexion 4+ 4-  Shoulder extension    Shoulder abduction 4+ 4-  Shoulder adduction    Shoulder internal rotation 4+ 3+  Shoulder external rotation 4+ 3+  Middle trapezius    Lower trapezius    Elbow flexion    Elbow extension    Wrist flexion    Wrist extension    Wrist ulnar deviation    Wrist radial deviation    Wrist pronation    Wrist supination    Grip strength (lbs) 30# 20#  (Blank rows = not tested)  SHOULDER SPECIAL TESTS: Impingement tests: Hawkins/Kennedy impingement test: positive  Rotator cuff assessment: External rotation lag sign: negative  PALPATION:  Moderate-to-severe tenderness to palpation along                                                                                                                              TREATMENT DATE:  [facility] Adult PT Treatment:  DATE: 07/13/23 Therapeutic Exercise: UBE level 2 3'/3' fwd and backward for warm up while taking subjective Rows GTB 2x10 Shoulder extension GTB 2x10 L shoulder adduction - GTB - 2x10 S/L ER  - 2# - 2x10 Lt S/L flexion - 1# - 2x10 Lt S/L horizontal abd - 1# - 2x10 Lt Supine shoulder flexion 1# Lt 2x10 Supine chest press uni - 2# - 2x10 Supine SA punch - 2# - 2x10 Supine shoulder horizontal abduction RTB 2x10 Supine diagonals RTB x10 BIL Manual Therapy Inferior and AP glides L GH joint - G III   [facility] Adult PT Treatment:                                                 DATE: 07/09/23 Therapeutic Exercise: UBE 2.5'/2.5' fwd and backward for warm up while taking subjective Rows GTB 2x10 Shoulder extension GTB 2x10 L shoulder adduction - GTB - 2x10 S/L ER  - 2# - 2x10 S/L flexion - 1# - 2x10 S/L horizontal abd - 1# - 2x10 Supine shoulder flexion with swiss ball Supine chest press uni - 2# - 2x10 Supine SA punch - 2# - 2x10 Supine shoulder horizontal abduction RTB 2x10 Pt education: R/C and role in shoulder movements  Manual Therapy  Inferior and AP glides L GH joint - G III    PATIENT EDUCATION: Education details: reviewed initial home exercise program; discussion of POC, prognosis and goals for skilled PT; discussed chronic use of UE for caregiving tasks and role in chronic L shoulder pain and weakness d/t "overuse" and likelihood of poor body mechanics    Person educated: Patient Education method: Explanation, Demonstration, and Handouts Education comprehension: verbalized understanding, returned demonstration, and needs further education  HOME EXERCISE PROGRAM: Access Code: [email or web address] URL: [email or web address] Date: 06/22/2023 Prepared by: Mauri Reading  Exercises - Median Nerve Flossing - Tray  - 1 x daily - 7 x weekly - 1 sets - 10 reps - 3 sec hold - Isometric Shoulder Extension at Wall  - 1 x daily - 7 x weekly - 1 sets - 10 reps - 3 sec hold - Isometric Shoulder Abduction at Wall  - 1 x daily - 7 x weekly - 1 sets - 10 reps - 3 sec hold  ASSESSMENT:  CLINICAL IMPRESSION: Patient presents to PT reporting continued soreness in her Lt shoulder. Session today continued to focus on periscapular and RTC strengthening. Patient was able to tolerate all prescribed exercises with no adverse effects. Patient continues to benefit from skilled PT services and should be progressed as able to improve functional independence.   EVAL: Jessica Galvan is a 69 y.o. female who was seen today for physical therapy evaluation and treatment for persistent  Left shoulder Pain with strength deficits. She has diminished L UE MMT scores, decreased L>R grip strength, and positive impingement tests. To has significant tenderness to palpation and symptomatic myofascial trigger points along supraspinatus mm belly and insertion point and throughout middle deltoid mm belly. She has related pain and difficulty with daily activities, including caregiving tasks including lifting, carrying, assisting with transfers, and other household responsibilities. She requires skilled PT services at this time to address relevant deficits and improve overall function.     OBJECTIVE IMPAIRMENTS: decreased activity tolerance, decreased strength, impaired UE functional use, and pain.   ACTIVITY LIMITATIONS: carrying, lifting, sleeping, bed mobility, and caring for  others  PARTICIPATION LIMITATIONS: meal prep, cleaning, laundry, interpersonal relationship, driving, shopping, and community activity  PERSONAL FACTORS: Past/current experiences, Time since onset of injury/illness/exacerbation, and 3+ comorbidities: Relevant PMHx includes ADHD, anxiety, arthritis, CKD, GERD, HTN  are also affecting patient's functional outcome.   REHAB POTENTIAL: Fair    CLINICAL DECISION MAKING: Evolving/moderate complexity  EVALUATION COMPLEXITY: Moderate   GOALS: Goals reviewed with patient? Yes  SHORT TERM GOALS: Target date: 07/20/2023   Patient will be independent with initial home program for UE mobility and strengthening  Baseline: provided at eval  Goal status: INITIAL  2.  Patient will demonstrate improved L grip strength to at least 25lbs. Baseline: see objective measures  Goal status: INITIAL  LONG TERM GOALS: Target date: 08/17/2023    Patient will report improved overall functional ability with quick DASH score of 20 or less.  Baseline: 34.1 Goal status: INITIAL  2.  Patient will demonstrate improved L shoulder MMT scores to at least 4+/5 throughout Baseline: see  objective measures  Goal status: INITIAL  3.  Patient will demonstrate improved L grip strength to at least 30lbs Baseline: see objective measures Goal status: INITIAL  4.  Patient will demonstrate ability to perform overhead lifting of at least 10# using appropriate body mechanics and with no more than minimal pain in order to safely perform normal daily/occupational tasks.   Baseline: unable  Goal status: INITIAL  5.  Patient will demonstrate ability to perform floor to waist lifting of at least 25# using appropriate body mechanics and with no more than minimal pain in order to safely perform normal daily/occupational tasks.   Baseline: unable  Goal status: INITIAL  6.  Patient will report ability to sleep through the night without waking from pain at least 4 nights/week.  Baseline: pain with positioning  Goal status: INITIAL  PLAN:  PT FREQUENCY: 1-2x/week  PT DURATION: 8 weeks  PLANNED INTERVENTIONS: 16109- PT Re-evaluation, 97110-Therapeutic exercises, 97530- Therapeutic activity, 97112- Neuromuscular re-education, 97535- Self Care, 60454- Manual therapy, G0283- Electrical stimulation (unattended), Dry Needling, Cryotherapy, and Moist heat  PLAN FOR NEXT SESSION: L shoulder isometric strengthening, periscapular strengthening, pain modulation via AAROM/aerobic activities, modalities, manual therapy, TPDN as indicated; wants to work on [facility] motions/lifting due to pain with these activities    Berta Minor PTA  07/13/2023 10:39 AM

## 2023-07-16 ENCOUNTER — Encounter: Payer: Self-pay | Admitting: Physical Therapy

## 2023-07-16 ENCOUNTER — Ambulatory Visit: Admitting: Physical Therapy

## 2023-07-16 DIAGNOSIS — G8929 Other chronic pain: Secondary | ICD-10-CM

## 2023-07-16 DIAGNOSIS — M25512 Pain in left shoulder: Secondary | ICD-10-CM | POA: Diagnosis not present

## 2023-07-16 NOTE — Therapy (Signed)
 OUTPATIENT PHYSICAL THERAPY TREATMENT NOTE   Patient Name: Jessica Galvan MRN: [id number] DOB:Sep 22, 1955, 68 y.o., female Today's Date: 07/16/2023  END OF SESSION:  PT End of Session - 07/16/23 1003     Visit Number 5    Number of Visits 9    Date for PT Re-Evaluation 08/17/23    Authorization Type [employer] MCR    PT Start Time 1002    PT Stop Time 1042    PT Time Calculation (min) 40 min    Activity Tolerance Patient tolerated treatment well    Behavior During Therapy WFL for tasks assessed/performed            Past Medical History:  Diagnosis Date   ADHD (attention deficit hyperactivity disorder), combined type    Anxiety    Arthritis    Cancer (HCC) 2013   LEFT LEG-skin cancer melanoma   Chronic kidney disease    membranous neuropathy   Environmental allergies    GERD (gastroesophageal reflux disease)    Hypercholesterolemia    Hypertension    Past Surgical History:  Procedure Laterality Date   BREAST BIOPSY Right    CESAREAN SECTION     2   COLONOSCOPY     POLYPECTOMY     RENAL BIOPSY, OPEN     Patient Active Problem List   Diagnosis Date Noted   Atrophic vaginitis 11/11/2016   Proteinuria 11/11/2016   Hyperlipidemia 07/03/2013    PCP: Elias Else, MD   REFERRING PROVIDER: Hurman Horn, MD   REFERRING DIAG: Pain in left shoulder [M25.512]   THERAPY DIAG:  Chronic left shoulder pain  Rationale for Evaluation and Treatment: Rehabilitation  ONSET DATE: 06/07/2023 date of referral  SUBJECTIVE:                                                                                                                                                                                      SUBJECTIVE STATEMENT: Pt reports she had some increased pain after last visit.  EVAL: Patient presents to PT d/t L shoulder pain that has been present for over a 1 year without known injury. 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 She is currently caring for her 35 year old grandson and her 54 y.o. mother. She also reports some pain with positioning at night. Although, this has become less painful. She wants to be more physically active for her general health and diabetes management; however, she feels limited with some exercises d/t L shoulder "popping" and pain.   Hand dominance: Left  PERTINENT HISTORY: Relevant PMHx includes ADHD, anxiety, arthritis, CKD, GERD,  HTN  PAIN:  Are you having pain?  Yes: NPRS scale: 4/10 Pain location: L shoulder (some paresthesia down to L hand/fingers) Pain description: soreness Aggravating factors: opening jars, overhead reaching Relieving factors: Holding/supporting my shoulder at night, tylenol pm (as needed)   PRECAUTIONS: None  RED FLAGS: None   WEIGHT BEARING RESTRICTIONS: No  FALLS:  Has patient fallen in last 6 months? 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 Have regular exercise routine.   NEXT MD VISIT:   OBJECTIVE:  Note: Objective measures were completed at Evaluation unless otherwise noted.  DIAGNOSTIC FINDINGS:  06/01/23 - per referring provider's: "X-ray findings show mild osteoarthritis in glenohumeral and acromioclavicular joints, consistent with OA and possible supraspinatus tendon impingement."   PATIENT SURVEYS:  Quick Dash 34.1%  Unable to open jar Moderate difficulty with heavy household chores, heavy lifting/carrying and recreational activities requiring impact through UE   Mild pins & needles sensation   COGNITION: Overall cognitive status: Within functional limits for tasks assessed     SENSATION: Not tested  POSTURE: WFL while seated  UPPER EXTREMITY ROM:    Active ROM Right eval Left eval  Shoulder flexion [facility]  Shoulder extension    Shoulder abduction [facility]  Shoulder adduction    Shoulder internal rotation    Shoulder external rotation    Elbow flexion    Elbow extension    Wrist flexion    Wrist extension    Wrist ulnar deviation    Wrist radial deviation    Wrist pronation    Wrist supination    (Blank rows = not tested)  UPPER EXTREMITY MMT:  MMT Right eval Left eval  Shoulder flexion 4+ 4-  Shoulder extension    Shoulder abduction 4+ 4-  Shoulder adduction    Shoulder internal rotation 4+ 3+  Shoulder external rotation 4+ 3+  Middle trapezius    Lower trapezius    Elbow flexion    Elbow extension    Wrist flexion    Wrist extension    Wrist ulnar deviation    Wrist radial deviation    Wrist pronation    Wrist supination    Grip strength (lbs) 30# 20#  (Blank rows = not tested)  SHOULDER SPECIAL TESTS: Impingement tests: Hawkins/Kennedy impingement test: positive  Rotator cuff assessment: External rotation lag sign: negative  PALPATION:  Moderate-to-severe tenderness to palpation along                                                                                                                              TREATMENT DATE:  [facility] Adult PT Treatment:  DATE: 07/16/23 Therapeutic Exercise: UBE 2.5'/2.5' fwd and backward for warm up while taking subjective Rows Blue TB 2x15 Shoulder extension GTB 2x15 L shoulder adduction - GTB - 2x15 S/L ER  - 2# - 2x10 Towel slide - 2x10 - 2# - unilateral  Supine shoulder flexion with swiss ball Supine chest press uni - 2# - 2x15 Supine SA punch - 2# - 2x15 Supine shoulder horizontal abduction RTB 2x10    PATIENT EDUCATION: Education details: reviewed initial home exercise program; discussion of POC, prognosis and goals for skilled PT; discussed chronic use of UE for caregiving tasks and role in chronic L  shoulder pain and weakness d/t "overuse" and likelihood of poor body mechanics    Person educated: Patient Education method: Explanation, Demonstration, and Handouts Education comprehension: verbalized understanding, returned demonstration, and needs further education  HOME EXERCISE PROGRAM: Access Code: [email or web address] URL: [email or web address] Date: 06/22/2023 Prepared by: Mauri Reading  Exercises - Median Nerve Flossing - Tray  - 1 x daily - 7 x weekly - 1 sets - 10 reps - 3 sec hold - Isometric Shoulder Extension at Wall  - 1 x daily - 7 x weekly - 1 sets - 10 reps - 3 sec hold - Isometric Shoulder Abduction at Wall  - 1 x daily - 7 x weekly - 1 sets - 10 reps - 3 sec hold  ASSESSMENT:  CLINICAL IMPRESSION: Pt reports increased pain in L shoulder following last visit.  We regressed several exercises today with concentration on AAROM and periscapular strengthening.  Pt does report some increase in "heaviness" with towel slide with resistance with final reps.  Well continue to increase intensity as tolerated.  EVAL: Damira is a 68 y.o. female who was seen today for physical therapy evaluation and treatment for persistent Left shoulder Pain with strength deficits. She has diminished L UE MMT scores, decreased L>R grip strength, and positive impingement tests. To has significant tenderness to palpation and symptomatic myofascial trigger points along supraspinatus mm belly and insertion point and throughout middle deltoid mm belly. She has related pain and difficulty with daily activities, including caregiving tasks including lifting, carrying, assisting with transfers, and other household responsibilities. She requires skilled PT services at this time to address relevant deficits and improve overall function.     OBJECTIVE IMPAIRMENTS: decreased activity tolerance, decreased strength, impaired UE functional use, and pain.   ACTIVITY LIMITATIONS: carrying, lifting, sleeping, bed  mobility, and caring for others  PARTICIPATION LIMITATIONS: meal prep, cleaning, laundry, interpersonal relationship, driving, shopping, and community activity  PERSONAL FACTORS: Past/current experiences, Time since onset of injury/illness/exacerbation, and 3+ comorbidities: Relevant PMHx includes ADHD, anxiety, arthritis, CKD, GERD, HTN  are also affecting patient's functional outcome.   REHAB POTENTIAL: Fair    CLINICAL DECISION MAKING: Evolving/moderate complexity  EVALUATION COMPLEXITY: Moderate   GOALS: Goals reviewed with patient? Yes  SHORT TERM GOALS: Target date: 07/20/2023   Patient will be independent with initial home program for UE mobility and strengthening  Baseline: provided at eval  Goal status: INITIAL  2.  Patient will demonstrate improved L grip strength to at least 25lbs. Baseline: see objective measures  Goal status: INITIAL  LONG TERM GOALS: Target date: 08/17/2023    Patient will report improved overall functional ability with quick DASH score of 20 or less.  Baseline: 34.1 Goal status: INITIAL  2.  Patient will demonstrate improved L shoulder MMT scores to at least 4+/5 throughout Baseline: see objective measures  Goal status: INITIAL  3.  Patient  will demonstrate improved L grip strength to at least 30lbs Baseline: see objective measures Goal status: INITIAL  4.  Patient will demonstrate ability to perform overhead lifting of at least 10# using appropriate body mechanics and with no more than minimal pain in order to safely perform normal daily/occupational tasks.   Baseline: unable  Goal status: INITIAL  5.  Patient will demonstrate ability to perform floor to waist lifting of at least 25# using appropriate body mechanics and with no more than minimal pain in order to safely perform normal daily/occupational tasks.   Baseline: unable  Goal status: INITIAL  6.  Patient will report ability to sleep through the night without waking from pain at  least 4 nights/week.  Baseline: pain with positioning  Goal status: INITIAL  PLAN:  PT FREQUENCY: 1-2x/week  PT DURATION: 8 weeks  PLANNED INTERVENTIONS: 78295- PT Re-evaluation, 97110-Therapeutic exercises, 97530- Therapeutic activity, 97112- Neuromuscular re-education, 97535- Self Care, 62130- Manual therapy, [id number]- Electrical stimulation (unattended), Dry Needling, Cryotherapy, and Moist heat  PLAN FOR NEXT SESSION: L shoulder isometric strengthening, periscapular strengthening, pain modulation via AAROM/aerobic activities, modalities, manual therapy, TPDN as indicated; wants to work on [facility] motions/lifting due to pain with these activities    Kimberlee Nearing Naveya Ellerman PT  07/16/2023 10:42 AM

## 2023-07-20 ENCOUNTER — Ambulatory Visit: Attending: Sports Medicine

## 2023-07-20 DIAGNOSIS — G8929 Other chronic pain: Secondary | ICD-10-CM | POA: Diagnosis not present

## 2023-07-20 DIAGNOSIS — M25512 Pain in left shoulder: Secondary | ICD-10-CM | POA: Insufficient documentation

## 2023-07-20 NOTE — Therapy (Addendum)
 OUTPATIENT PHYSICAL THERAPY TREATMENT NOTE   Patient Name: Jessica Galvan MRN: [id number] DOB:11-26-1955, 68 y.o., female Today's Date: 07/20/2023  END OF SESSION:  PT End of Session - 07/20/23 1005     Visit Number 6    Number of Visits 9    Date for PT Re-Evaluation 08/17/23    Authorization Type [employer] MCR    PT Start Time 1000    PT Stop Time 1039    PT Time Calculation (min) 39 min    Activity Tolerance Patient tolerated treatment well    Behavior During Therapy WFL for tasks assessed/performed             Past Medical History:  Diagnosis Date   ADHD (attention deficit hyperactivity disorder), combined type    Anxiety    Arthritis    Cancer (HCC) 2013   LEFT LEG-skin cancer melanoma   Chronic kidney disease    membranous neuropathy   Environmental allergies    GERD (gastroesophageal reflux disease)    Hypercholesterolemia    Hypertension    Past Surgical History:  Procedure Laterality Date   BREAST BIOPSY Right    CESAREAN SECTION     2   COLONOSCOPY     POLYPECTOMY     RENAL BIOPSY, OPEN     Patient Active Problem List   Diagnosis Date Noted   Atrophic vaginitis 11/11/2016   Proteinuria 11/11/2016   Hyperlipidemia 07/03/2013    PCP: Elias Else, MD  REFERRING PROVIDER: Hurman Horn, MD  REFERRING DIAG: Pain in left shoulder [M25.512]   THERAPY DIAG:  Chronic left shoulder pain  Rationale for Evaluation and Treatment: Rehabilitation  ONSET DATE: 06/07/2023 date of referral  SUBJECTIVE:                                                                                                                                                                                      SUBJECTIVE STATEMENT: 07/20/2023 Patient reports that she is doing better since starting PT. However, she has some ongoing soreness and tightness in left shoulder.  EVAL: Patient presents to PT d/t L shoulder pain that has been present for over a 1 year without known injury.  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 She is currently caring for her 66 year old grandson and her 106 y.o. mother. She also reports some pain with positioning at night. Although, this has become less painful. She wants to be more physically active for her general health and diabetes management; however, she feels limited with some exercises d/t L shoulder "popping" and pain.   Hand dominance: Left  PERTINENT HISTORY: Relevant PMHx includes ADHD, anxiety, arthritis, CKD, GERD, HTN  PAIN:  Are you having pain?  Yes: NPRS scale: 4/10 Pain location: L shoulder (some paresthesia down to L hand/fingers) Pain description: soreness Aggravating factors: opening jars, overhead reaching Relieving factors: Holding/supporting my shoulder at night, tylenol pm (as needed)   PRECAUTIONS: None  RED FLAGS: None   WEIGHT BEARING RESTRICTIONS: No  FALLS:  Has patient fallen in last 6 months? 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 Have regular exercise routine.   NEXT MD VISIT:   OBJECTIVE:  Note: Objective measures were completed at Evaluation unless otherwise noted.  DIAGNOSTIC FINDINGS:  06/01/23 - per referring provider's: "X-ray findings show mild osteoarthritis in glenohumeral and acromioclavicular joints, consistent with OA and possible supraspinatus tendon impingement."   PATIENT SURVEYS:  Quick Dash 34.1%  Unable to open jar Moderate difficulty with heavy household chores, heavy lifting/carrying and recreational activities requiring impact through UE   Mild pins & needles sensation   COGNITION: Overall cognitive status: Within functional limits for tasks  assessed     SENSATION: Not tested  POSTURE: WFL while seated  UPPER EXTREMITY ROM:   Active ROM Right eval Left eval  Shoulder flexion [facility]  Shoulder extension    Shoulder abduction [facility]  Shoulder adduction    Shoulder internal rotation    Shoulder external rotation    Elbow flexion    Elbow extension    Wrist flexion    Wrist extension    Wrist ulnar deviation    Wrist radial deviation    Wrist pronation    Wrist supination    (Blank rows = not tested)  UPPER EXTREMITY MMT:  MMT Right eval Left eval  Shoulder flexion 4+ 4-  Shoulder extension    Shoulder abduction 4+ 4-  Shoulder adduction    Shoulder internal rotation 4+ 3+  Shoulder external rotation 4+ 3+  Middle trapezius    Lower trapezius    Elbow flexion    Elbow extension    Wrist flexion    Wrist extension    Wrist ulnar deviation    Wrist radial deviation    Wrist pronation    Wrist supination    Grip strength (lbs) 30# 20#  (Blank rows = not tested)  SHOULDER SPECIAL TESTS: Impingement tests: Hawkins/Kennedy impingement test: positive  Rotator cuff assessment: External rotation lag sign: negative  PALPATION:  Moderate-to-severe tenderness to palpation along                                                                                                                              TREATMENT DATE:   [facility] Adult PT Treatment:  DATE: 07/20/2023  Therapeutic Exercise: UBE 2.5'/2.5' fwd and backward for warm up while taking subjective Rows Blue TB 2x15 Shoulder extension GTB 2x15 L shoulder adduction - GTB - 2x15 Supine shoulder flexion with 2lb bar x 20   Therapeutic Activity:  Towel slide - 2x10 - 2# - unilateral  Finger ladder flexion x 5, scaption x 5 Counter to shelf (shoulder height) 1# x 2, 2lb, 3lb x 5  Counter to shelf (overhead) 1# x 2, 2lb  Supine chest press with 2lb bar x 20    [facility] Adult PT Treatment:                                                 DATE: 07/16/23 Therapeutic Exercise: UBE 2.5'/2.5' fwd and backward for warm up while taking subjective Rows Blue TB 2x15 Shoulder extension GTB 2x15 L shoulder adduction - GTB - 2x15 S/L ER  - 2# - 2x10 Towel slide - 2x10 - 2# - unilateral  Supine shoulder flexion with swiss ball Supine chest press uni - 2# - 2x15 Supine SA punch - 2# - 2x15 Supine shoulder horizontal abduction RTB 2x10    PATIENT EDUCATION: Education details: reviewed initial home exercise program; discussion of POC, prognosis and goals for skilled PT; discussed chronic use of UE for caregiving tasks and role in chronic L shoulder pain and weakness d/t "overuse" and likelihood of poor body mechanics    Person educated: Patient Education method: Explanation, Demonstration, and Handouts Education comprehension: verbalized understanding, returned demonstration, and needs further education  HOME EXERCISE PROGRAM: Access Code: [email or web address] URL: [email or web address] Date: 06/22/2023 Prepared by: Mauri Reading  Exercises - Median Nerve Flossing - Tray  - 1 x daily - 7 x weekly - 1 sets - 10 reps - 3 sec hold - Isometric Shoulder Extension at Wall  - 1 x daily - 7 x weekly - 1 sets - 10 reps - 3 sec hold - Isometric Shoulder Abduction at Wall  - 1 x daily - 7 x weekly - 1 sets - 10 reps - 3 sec hold  ASSESSMENT:  CLINICAL IMPRESSION: 07/20/2023 Patient continues to respond well to current POC. She was able to spend additional time today working on repetitive [place] activities, with light weights of 1-2 lbs. She reports some muscle fatigue at end of session. She would continue to benefit from skilled PT intervention to improve muscular endurance of shoulder and periscapular musculature. Plan is to progress independent HEP at next visit.   EVAL: Alberta is a 68 y.o. female who was seen today for physical therapy evaluation and treatment for persistent Left shoulder Pain with strength  deficits. She has diminished L UE MMT scores, decreased L>R grip strength, and positive impingement tests. To has significant tenderness to palpation and symptomatic myofascial trigger points along supraspinatus mm belly and insertion point and throughout middle deltoid mm belly. She has related pain and difficulty with daily activities, including caregiving tasks including lifting, carrying, assisting with transfers, and other household responsibilities. She requires skilled PT services at this time to address relevant deficits and improve overall function.     OBJECTIVE IMPAIRMENTS: decreased activity tolerance, decreased strength, impaired UE functional use, and pain.   ACTIVITY LIMITATIONS: carrying, lifting, sleeping, bed mobility, and caring for others  PARTICIPATION LIMITATIONS: meal prep, cleaning, laundry, interpersonal relationship, driving, shopping, and community activity  PERSONAL FACTORS:  Past/current experiences, Time since onset of injury/illness/exacerbation, and 3+ comorbidities: Relevant PMHx includes ADHD, anxiety, arthritis, CKD, GERD, HTN  are also affecting patient's functional outcome.   REHAB POTENTIAL: Fair    CLINICAL DECISION MAKING: Evolving/moderate complexity  EVALUATION COMPLEXITY: Moderate   GOALS: Goals reviewed with patient? Yes  SHORT TERM GOALS: Target date: 07/20/2023   Patient will be independent with initial home program for UE mobility and strengthening  Baseline: provided at eval  Goal status: INITIAL  2.  Patient will demonstrate improved L grip strength to at least 25lbs. Baseline: see objective measures  Goal status: INITIAL  LONG TERM GOALS: Target date: 08/17/2023   Patient will report improved overall functional ability with quick DASH score of 20 or less.  Baseline: 34.1 Goal status: INITIAL  2.  Patient will demonstrate improved L shoulder MMT scores to at least 4+/5 throughout Baseline: see objective measures  Goal status:  INITIAL  3.  Patient will demonstrate improved L grip strength to at least 30lbs Baseline: see objective measures Goal status: INITIAL  4.  Patient will demonstrate ability to perform overhead lifting of at least 10# using appropriate body mechanics and with no more than minimal pain in order to safely perform normal daily/occupational tasks.   Baseline: unable  Goal status: INITIAL  5.  Patient will demonstrate ability to perform floor to waist lifting of at least 25# using appropriate body mechanics and with no more than minimal pain in order to safely perform normal daily/occupational tasks.   Baseline: unable  Goal status: INITIAL  6.  Patient will report ability to sleep through the night without waking from pain at least 4 nights/week.  Baseline: pain with positioning  Goal status: INITIAL  PLAN:  PT FREQUENCY: 1-2x/week  PT DURATION: 8 weeks  PLANNED INTERVENTIONS: 29562- PT Re-evaluation, 97110-Therapeutic exercises, 97530- Therapeutic activity, 97112- Neuromuscular re-education, 97535- Self Care, 13086- Manual therapy, G0283- Electrical stimulation (unattended), Dry Needling, Cryotherapy, and Moist heat  PLAN FOR NEXT SESSION: L shoulder isometric strengthening, periscapular strengthening, pain modulation via AAROM/aerobic activities, modalities, manual therapy, TPDN as indicated; wants to work on [facility] motions/lifting due to pain with these activities    Mauri Reading, PT, DPT  07/20/2023 1:45 PM

## 2023-07-23 ENCOUNTER — Ambulatory Visit: Admitting: Physical Therapy

## 2023-07-23 ENCOUNTER — Encounter: Payer: Self-pay | Admitting: Physical Therapy

## 2023-07-23 DIAGNOSIS — G8929 Other chronic pain: Secondary | ICD-10-CM

## 2023-07-23 DIAGNOSIS — M25512 Pain in left shoulder: Secondary | ICD-10-CM | POA: Diagnosis not present

## 2023-07-23 NOTE — Therapy (Addendum)
 PHYSICAL THERAPY UNPLANNED DISCHARGE SUMMARY   Visits from Start of Care: 7  Current functional level related to goals / functional outcomes: Current status unknown   Remaining deficits: Current status unknown   Education / Equipment: Pt has not returned since visit listed below  Patient goals were not assessed. Patient is being discharged due to not returning since the last visit.  (the note below was addended to include the above D/C summary on 12/29/23)  OUTPATIENT PHYSICAL THERAPY TREATMENT NOTE   Patient Name: Jessica Galvan MRN: [id number] DOB:[birth year], 68 y.o., female Today's Date: 07/23/2023  END OF SESSION:  PT End of Session - 07/23/23 1001     Visit Number 7    Number of Visits 9    Date for PT Re-Evaluation 08/17/23    Authorization Type [employer] MCR    PT Start Time 1000    PT Stop Time 1040    PT Time Calculation (min) 40 min    Activity Tolerance Patient tolerated treatment well    Behavior During Therapy WFL for tasks assessed/performed             Past Medical History:  Diagnosis Date   ADHD (attention deficit hyperactivity disorder), combined type    Anxiety    Arthritis    Cancer (HCC) 2013   LEFT LEG-skin cancer melanoma   Chronic kidney disease    membranous neuropathy   Environmental allergies    GERD (gastroesophageal reflux disease)    Hypercholesterolemia    Hypertension    Past Surgical History:  Procedure Laterality Date   BREAST BIOPSY Right    CESAREAN SECTION     2   COLONOSCOPY     POLYPECTOMY     RENAL BIOPSY, OPEN     Patient Active Problem List   Diagnosis Date Noted   Atrophic vaginitis 11/11/2016   Proteinuria 11/11/2016   Hyperlipidemia 07/03/2013    PCP: Gib Charleston, MD  REFERRING PROVIDER: Dasie Fitch, MD  REFERRING DIAG: Pain in left shoulder [M25.512]   THERAPY DIAG:  Chronic left shoulder pain  Rationale for Evaluation and Treatment: Rehabilitation  ONSET DATE: 06/07/2023 date of  referral  SUBJECTIVE:                                                                                                                                                                                      SUBJECTIVE STATEMENT: 07/23/2023: Pt reports that her shoulder is doing pretty well  EVAL: Patient presents to PT d/t L shoulder pain that has been present for over a 1 year without known injury. 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 She is currently caring for her 31 year old grandson and her 66 y.o. mother. She also reports some pain with positioning at night. Although, this has become less painful. She wants to be more physically active for her general health and diabetes management; however, she feels limited with some exercises d/t L shoulder popping and pain.   Hand dominance: Left  PERTINENT HISTORY: Relevant PMHx includes ADHD, anxiety, arthritis, CKD, GERD, HTN  PAIN:  Are you having pain?  Yes: NPRS scale: 4/10 Pain location: L shoulder (some paresthesia down to L hand/fingers) Pain description: soreness Aggravating factors: opening jars, overhead reaching Relieving factors: Holding/supporting my shoulder at night, tylenol  pm (as needed)   PRECAUTIONS: None  RED FLAGS: None   WEIGHT BEARING RESTRICTIONS: No  FALLS:  Has patient fallen in last 6 months? 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 Have regular exercise routine.   NEXT MD VISIT:   OBJECTIVE:  Note: Objective measures were completed at Evaluation unless otherwise noted.  DIAGNOSTIC FINDINGS:  06/01/23 - per referring provider's: X-ray findings show mild osteoarthritis in glenohumeral and  acromioclavicular joints, consistent with OA and possible supraspinatus tendon impingement.   PATIENT SURVEYS:  Quick Dash 34.1%  Unable to open jar Moderate difficulty with heavy household chores, heavy lifting/carrying and recreational activities requiring impact through UE   Mild pins & needles sensation   COGNITION: Overall cognitive status: Within functional limits for tasks assessed     SENSATION: Not tested  POSTURE: WFL while seated  UPPER EXTREMITY ROM:   Active ROM Right eval Left eval  Shoulder flexion [facility]  Shoulder extension    Shoulder abduction [facility]  Shoulder adduction    Shoulder internal rotation    Shoulder external rotation    Elbow flexion    Elbow extension    Wrist flexion    Wrist extension    Wrist ulnar deviation    Wrist radial deviation    Wrist pronation    Wrist supination    (Blank rows = not tested)  UPPER EXTREMITY MMT:  MMT Right eval Left eval  Shoulder flexion 4+ 4-  Shoulder extension    Shoulder abduction 4+ 4-  Shoulder adduction    Shoulder internal rotation 4+ 3+  Shoulder external rotation 4+ 3+  Middle trapezius    Lower trapezius    Elbow flexion    Elbow extension    Wrist flexion    Wrist extension    Wrist ulnar deviation    Wrist radial deviation    Wrist pronation    Wrist supination    Grip strength (lbs) 30# 20#  (Blank rows = not tested)  SHOULDER SPECIAL TESTS: Impingement tests: Hawkins/Kennedy impingement test: positive  Rotator cuff assessment: External rotation lag sign: negative  PALPATION:  Moderate-to-severe tenderness to palpation along  TREATMENT DATE:   [facility] Adult PT Treatment:                                                DATE: 07/23/2023  Therapeutic Exercise: UBE 2.5'/2.5' fwd and backward for warm up while taking subjective Black TB 2x15 Shoulder  extension GTB 2x15 L shoulder adduction - GTB - 2x15  Therapeutic Activity:  Towel slide - 2x10 - 2# - unilateral - 10x 3# Finger ladder flexion x 5, scaption x 5 Counter to shelf (shoulder height) 2# - 2x10 Counter to shelf (overhead) 0# x 2x10 Supine chest press with 2.5 lb bar x 30  Supine shoulder flexion with 2.5 lb bar x 30  Updating HEP   [facility] Adult PT Treatment:                                                DATE: 07/16/23 Therapeutic Exercise: UBE 2.5'/2.5' fwd and backward for warm up while taking subjective Rows Blue TB 2x15 Shoulder extension GTB 2x15 L shoulder adduction - GTB - 2x15 S/L ER  - 2# - 2x10 Towel slide - 2x10 - 2# - unilateral  Supine shoulder flexion with swiss ball Supine chest press uni - 2# - 2x15 Supine SA punch - 2# - 2x15 Supine shoulder horizontal abduction RTB 2x10    PATIENT EDUCATION: Education details: reviewed initial home exercise program; discussion of POC, prognosis and goals for skilled PT; discussed chronic use of UE for caregiving tasks and role in chronic L shoulder pain and weakness d/t overuse and likelihood of poor body mechanics    Person educated: Patient Education method: Explanation, Demonstration, and Handouts Education comprehension: verbalized understanding, returned demonstration, and needs further education  HOME EXERCISE PROGRAM: Access Code: [email or web address] URL: [email or web address] Date: 07/23/2023 Prepared by: Helene Gasmen  Exercises - Standing Shoulder Row with Anchored Resistance  - 1 x daily - 3-4 x weekly - 3 sets - 10 reps - Shoulder extension with resistance - Neutral  - 1 x daily - 3-4 x weekly - 3 sets - 10 reps - Shoulder Flexion Wall Slide with Towel  - 1 x daily - 3-4 x weekly - 2 sets - 10 reps - Sidelying Shoulder ER with Towel and Dumbbell  - 1 x daily - 3-4 x weekly - 3 sets - 10 reps - Shoulder Overhead Press in Flexion with Dumbbells  - 1 x daily - 3-4 x weekly - 2 sets - 10  reps  ASSESSMENT:  CLINICAL IMPRESSION: 07/23/2023 Aleeta tolerated session well with no adverse reaction.  We continue to work on [facility] reaching tolerance to good effect.  Pt is able to complete towel slide with 2# with no increase in sxs but does have increased pain with 3#.  She does well with reaching into a cabinet with minimal weight but continues to have an increase in pain with more that 1#.  Her grip strength is improved on re-check today.  She is going into a very busy time and would like to hold for a couple of weeks.  She can follow up PRN until 4/29.  EVAL: Tyah is a 68 y.o. female who was seen today for physical therapy evaluation and treatment for persistent Left shoulder  Pain with strength deficits. She has diminished L UE MMT scores, decreased L>R grip strength, and positive impingement tests. To has significant tenderness to palpation and symptomatic myofascial trigger points along supraspinatus mm belly and insertion point and throughout middle deltoid mm belly. She has related pain and difficulty with daily activities, including caregiving tasks including lifting, carrying, assisting with transfers, and other household responsibilities. She requires skilled PT services at this time to address relevant deficits and improve overall function.     OBJECTIVE IMPAIRMENTS: decreased activity tolerance, decreased strength, impaired UE functional use, and pain.   ACTIVITY LIMITATIONS: carrying, lifting, sleeping, bed mobility, and caring for others  PARTICIPATION LIMITATIONS: meal prep, cleaning, laundry, interpersonal relationship, driving, shopping, and community activity  PERSONAL FACTORS: Past/current experiences, Time since onset of injury/illness/exacerbation, and 3+ comorbidities: Relevant PMHx includes ADHD, anxiety, arthritis, CKD, GERD, HTN are also affecting patient's functional outcome.   REHAB POTENTIAL: Fair    CLINICAL DECISION MAKING: Evolving/moderate complexity  EVALUATION  COMPLEXITY: Moderate   GOALS: Goals reviewed with patient? Yes  SHORT TERM GOALS: Target date: 07/20/2023   Patient will be independent with initial home program for UE mobility and strengthening  Baseline: provided at eval  Goal status: MET  2.  Patient will demonstrate improved L grip strength to at least 25lbs. Baseline: see objective measures  4/4: 33 lbs Goal status: Ongoing  LONG TERM GOALS: Target date: 08/17/2023   Patient will report improved overall functional ability with quick DASH score of 20 or less.  Baseline: 34.1 Goal status: INITIAL  2.  Patient will demonstrate improved L shoulder MMT scores to at least 4+/5 throughout Baseline: see objective measures  Goal status: INITIAL  3.  Patient will demonstrate improved L grip strength to at least 30lbs Baseline: see objective measures Goal status: INITIAL  4.  Patient will demonstrate ability to perform overhead lifting of at least 10# using appropriate body mechanics and with no more than minimal pain in order to safely perform normal daily/occupational tasks.   Baseline: unable  Goal status: INITIAL  5.  Patient will demonstrate ability to perform floor to waist lifting of at least 25# using appropriate body mechanics and with no more than minimal pain in order to safely perform normal daily/occupational tasks.   Baseline: unable  Goal status: INITIAL  6.  Patient will report ability to sleep through the night without waking from pain at least 4 nights/week.  Baseline: pain with positioning  Goal status: INITIAL  PLAN:  PT FREQUENCY: 1-2x/week  PT DURATION: 8 weeks  PLANNED INTERVENTIONS: 02835- PT Re-evaluation, 97110-Therapeutic exercises, 97530- Therapeutic activity, 97112- Neuromuscular re-education, 97535- Self Care, 02859- Manual therapy, G0283- Electrical stimulation (unattended), Dry Needling, Cryotherapy, and Moist heat  PLAN FOR NEXT SESSION: L shoulder isometric strengthening, periscapular  strengthening, pain modulation via AAROM/aerobic activities, modalities, manual therapy, TPDN as indicated; wants to work on [facility] motions/lifting due to pain with these activities    Helene BRAVO Melayna Robarts PT 07/23/2023 10:50 AM

## 2023-11-04 DIAGNOSIS — E7800 Pure hypercholesterolemia, unspecified: Secondary | ICD-10-CM | POA: Diagnosis not present

## 2023-11-04 DIAGNOSIS — R7303 Prediabetes: Secondary | ICD-10-CM | POA: Diagnosis not present

## 2023-11-05 DIAGNOSIS — F900 Attention-deficit hyperactivity disorder, predominantly inattentive type: Secondary | ICD-10-CM | POA: Diagnosis not present

## 2023-11-05 DIAGNOSIS — Z1331 Encounter for screening for depression: Secondary | ICD-10-CM | POA: Diagnosis not present

## 2023-11-05 DIAGNOSIS — Z0000 Encounter for general adult medical examination without abnormal findings: Secondary | ICD-10-CM | POA: Diagnosis not present

## 2023-11-05 DIAGNOSIS — F419 Anxiety disorder, unspecified: Secondary | ICD-10-CM | POA: Diagnosis not present

## 2023-11-05 DIAGNOSIS — N289 Disorder of kidney and ureter, unspecified: Secondary | ICD-10-CM | POA: Diagnosis not present

## 2023-11-05 DIAGNOSIS — E7800 Pure hypercholesterolemia, unspecified: Secondary | ICD-10-CM | POA: Diagnosis not present

## 2023-11-05 DIAGNOSIS — R7303 Prediabetes: Secondary | ICD-10-CM | POA: Diagnosis not present

## 2023-11-05 DIAGNOSIS — M069 Rheumatoid arthritis, unspecified: Secondary | ICD-10-CM | POA: Diagnosis not present

## 2023-11-11 DIAGNOSIS — E785 Hyperlipidemia, unspecified: Secondary | ICD-10-CM | POA: Diagnosis not present

## 2023-11-11 DIAGNOSIS — R21 Rash and other nonspecific skin eruption: Secondary | ICD-10-CM | POA: Diagnosis not present

## 2023-11-11 DIAGNOSIS — I10 Essential (primary) hypertension: Secondary | ICD-10-CM | POA: Diagnosis not present

## 2023-11-11 DIAGNOSIS — N052 Unspecified nephritic syndrome with diffuse membranous glomerulonephritis: Secondary | ICD-10-CM | POA: Diagnosis not present

## 2023-11-11 DIAGNOSIS — M1990 Unspecified osteoarthritis, unspecified site: Secondary | ICD-10-CM | POA: Diagnosis not present

## 2023-12-05 DIAGNOSIS — N3001 Acute cystitis with hematuria: Secondary | ICD-10-CM | POA: Diagnosis not present

## 2023-12-05 NOTE — Progress Notes (Signed)
 Subjective:   Chief Complaint  Patient presents with  . Urinary Tract Infection    Noticed funny feeling in abdomen yesterday but a little bit of a back ache this morning. Patient noticed a little blood in urine this morning and noticed a change in frequency. She denies known fever. She has not taken anything otc for urinary symptoms. She is in remission for nephropathy.      History of Present Illness Patient with history of membranous nephropathy presents with hematuria accompanied by mild discomfort with urination.  Noticed hematuria this morning after wiping. Experienced unusual sensation during urination yesterday, without pain. Mild backache last night. No vomiting, nausea, fever, or abdominal pain. 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 Recent blood work (2 weeks ago) showed GFR 80. Started Farxiga in January 2025, no side effects.    Parts of patient history reviewed include PMH, problem list, medications, allergies, and social history.  Objective:   Vitals:   12/05/23 1140 12/05/23 1146  BP: (!) 126/90 (!) 128/91  Pulse:  75  Resp:  16  Temp:  98.6 F (37 C)  TempSrc:  Tympanic  SpO2:  100%  Weight:  58.1 kg (128 lb)  Height:  1.543 m (5' 0.75)    Physical Exam Vitals reviewed.  Constitutional:      Appearance: Normal appearance.  HENT:     Head: Normocephalic and atraumatic.     Mouth/Throat:     Mouth: Mucous membranes are moist.   Eyes:     Extraocular Movements: Extraocular movements intact.    Cardiovascular:     Rate and Rhythm: Normal rate and regular rhythm.  Pulmonary:     Effort: Pulmonary effort is normal.  Abdominal:     Palpations: Abdomen is soft.     Tenderness: There is no abdominal tenderness. There is no right CVA tenderness or left CVA tenderness.   Musculoskeletal:        General: Normal range of motion.     Cervical back: Normal range of motion.   Skin:    General: Skin is warm and dry.    Neurological:     General: No focal deficit present.     Mental Status: She is alert and oriented to person, place, and time.            Assessment/Plan:   Jessica Galvan was seen today for urinary tract infection.  Diagnoses and all orders for this visit:  Acute cystitis with hematuria -     POC Urinalysis Auto without Microscopic -     Urine Culture -     Basic Metabolic Panel -     cefPODOXime (VANTIN) 200 mg tablet; Take 1 tablet (200 mg total) by mouth 2 (two) times a day for 7 days.     Assessment & Plan Jessica Galvan is a 68 y.o. female is here for hematuria with mild discomfort with urination.  POCT UA with large blood, small leuks, small protein, large glucose. Afebrile and well appearing with no report of N/V nor flank pain, doubt kidney stone at this time. Would expect large glucose since she is on farxiga. Small protein is c/w most recent UA at specialist office, likely secondary to her nephropathy. Overall clinical picture suggestive of acute UTI with hematuria. Will treat empirically with vantin and send culture as well as check kidney function today. Recommend aggressive hydration and close f/u with PCP and/or nephrologist.  Patient will return for new or worsening symptoms. I discussed  the findings today, diagnosis/differential diagnosis, plan and red flags that require return for reevaluation with PCP, Urgent care or EMERGENCY. Patient was agreeable to outlined plan and questions were answered, felt stable for discharge.    All pertinent previous records reviewed prior to and during visit.   Follow up with PCP   Electronically signed by: Jon Earnie Flank, NP 12/05/2023 12:37 PM

## 2023-12-24 ENCOUNTER — Other Ambulatory Visit (HOSPITAL_COMMUNITY): Payer: Self-pay

## 2023-12-24 MED ORDER — AMPHETAMINE-DEXTROAMPHET ER 30 MG PO CP24
30.0000 mg | ORAL_CAPSULE | Freq: Every morning | ORAL | 0 refills | Status: AC
  Filled 2023-12-24: qty 30, 30d supply, fill #0

## 2024-03-01 DIAGNOSIS — N052 Unspecified nephritic syndrome with diffuse membranous glomerulonephritis: Secondary | ICD-10-CM | POA: Diagnosis not present

## 2024-03-01 DIAGNOSIS — E785 Hyperlipidemia, unspecified: Secondary | ICD-10-CM | POA: Diagnosis not present
